# Patient Record
Sex: Female | Born: 1980 | Race: White | Hispanic: No | Marital: Married | Smoking: Never smoker
Health system: Southern US, Community
[De-identification: ages and names within clinical notes are randomized; demographics above are authoritative.]

## PROBLEM LIST (undated history)

## (undated) DIAGNOSIS — I8390 Asymptomatic varicose veins of unspecified lower extremity: Secondary | ICD-10-CM

## (undated) DIAGNOSIS — M79606 Pain in leg, unspecified: Secondary | ICD-10-CM

## (undated) DIAGNOSIS — R0989 Other specified symptoms and signs involving the circulatory and respiratory systems: Secondary | ICD-10-CM

## (undated) DIAGNOSIS — E162 Hypoglycemia, unspecified: Secondary | ICD-10-CM

## (undated) DIAGNOSIS — F329 Major depressive disorder, single episode, unspecified: Secondary | ICD-10-CM

## (undated) DIAGNOSIS — R519 Headache, unspecified: Secondary | ICD-10-CM

## (undated) DIAGNOSIS — Z8619 Personal history of other infectious and parasitic diseases: Secondary | ICD-10-CM

## (undated) DIAGNOSIS — R51 Headache: Secondary | ICD-10-CM

## (undated) HISTORY — DX: Pain in leg, unspecified: M79.606

## (undated) HISTORY — DX: Other specified symptoms and signs involving the circulatory and respiratory systems: R09.89

## (undated) HISTORY — PX: TONSILLECTOMY: SUR1361

## (undated) HISTORY — DX: Personal history of other infectious and parasitic diseases: Z86.19

## (undated) HISTORY — DX: Asymptomatic varicose veins of unspecified lower extremity: I83.90

---

## 2005-06-13 ENCOUNTER — Encounter: Admission: RE | Admit: 2005-06-13 | Discharge: 2005-06-13 | Payer: Self-pay | Admitting: Family Medicine

## 2006-12-24 ENCOUNTER — Inpatient Hospital Stay (HOSPITAL_COMMUNITY): Admission: AD | Admit: 2006-12-24 | Discharge: 2006-12-24 | Payer: Self-pay | Admitting: Obstetrics and Gynecology

## 2006-12-30 ENCOUNTER — Inpatient Hospital Stay (HOSPITAL_COMMUNITY): Admission: AD | Admit: 2006-12-30 | Discharge: 2006-12-30 | Payer: Self-pay | Admitting: Obstetrics and Gynecology

## 2006-12-30 ENCOUNTER — Emergency Department (HOSPITAL_COMMUNITY): Admission: EM | Admit: 2006-12-30 | Discharge: 2006-12-30 | Payer: Self-pay | Admitting: Emergency Medicine

## 2007-04-28 ENCOUNTER — Inpatient Hospital Stay (HOSPITAL_COMMUNITY): Admission: AD | Admit: 2007-04-28 | Discharge: 2007-05-03 | Payer: Self-pay | Admitting: Obstetrics and Gynecology

## 2007-08-03 ENCOUNTER — Encounter: Admission: RE | Admit: 2007-08-03 | Discharge: 2007-08-03 | Payer: Self-pay | Admitting: Family Medicine

## 2008-11-23 IMAGING — US US ABDOMEN COMPLETE
1 series · 14 of 25 positions shown · non-contrast
Comparison: none

CLINICAL DATA: Elevated liver function tests.
 ABDOMEN ULTRASOUND:
TECHNIQUE: Complete abdominal ultrasound examination was performed including evaluation of the liver, gallbladder, bile ducts, pancreas, kidneys, spleen, IVC, and abdominal aorta.

[Series 1: us abdomen complete · 0.29mm/px · 14 of 70 slices shown]
[im 1/70]
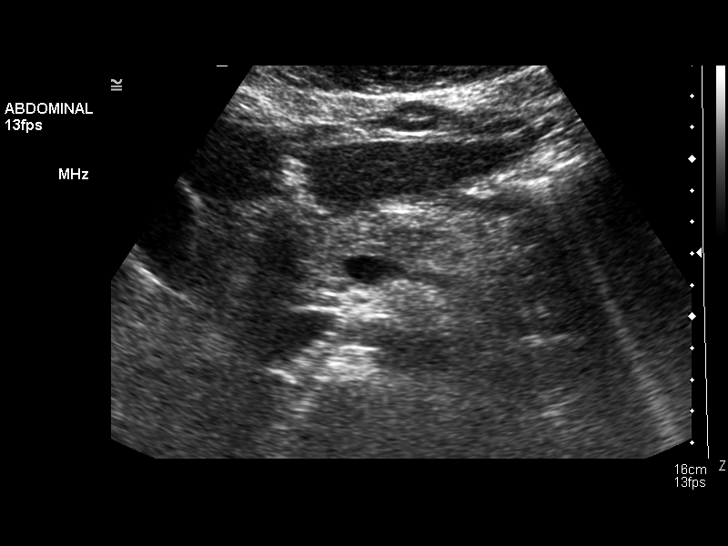
[im 6/70]
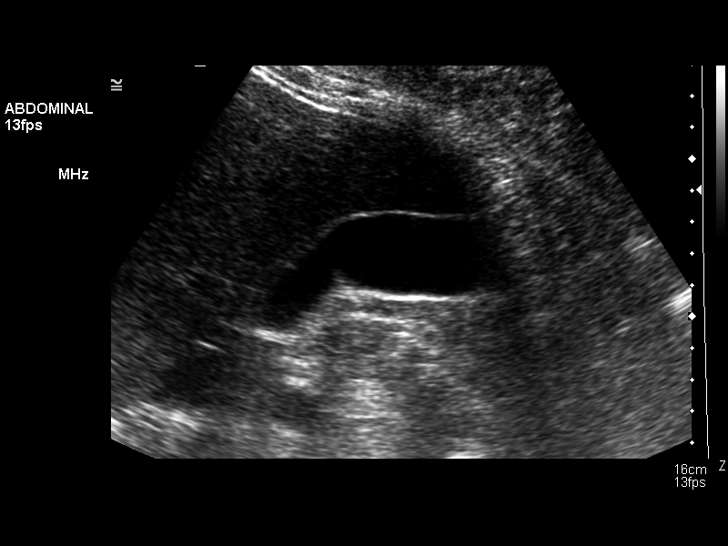
[im 12/70]
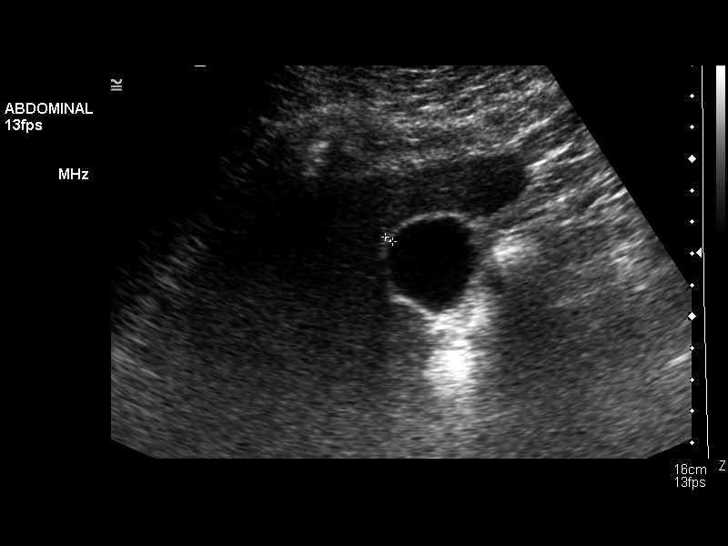
[im 18/70]
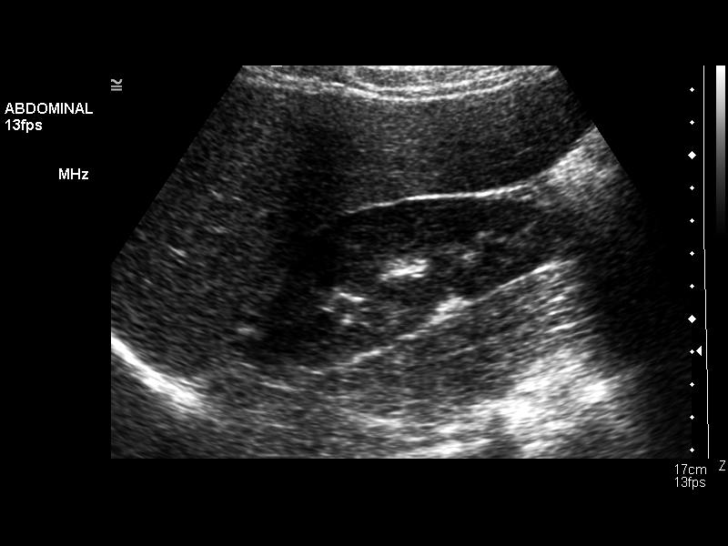
[im 24/70]
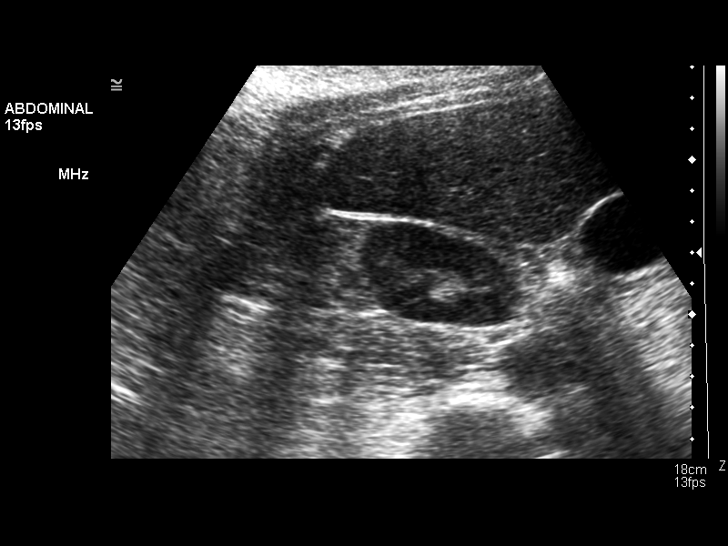
[im 26/70]
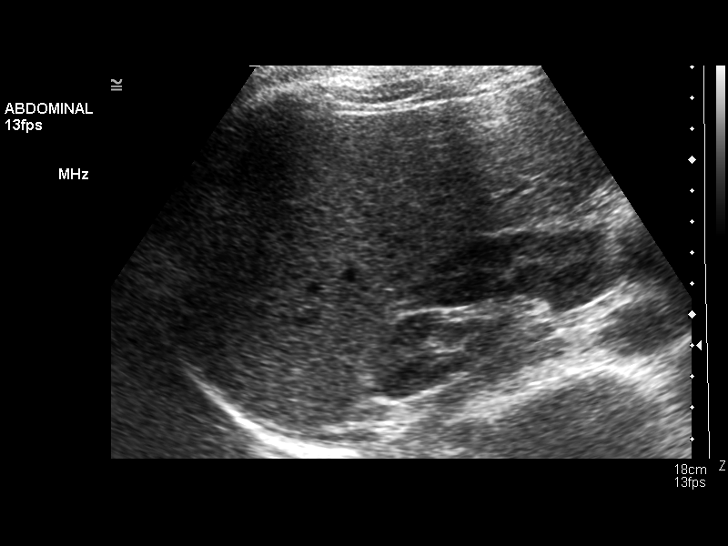
[im 32/70]
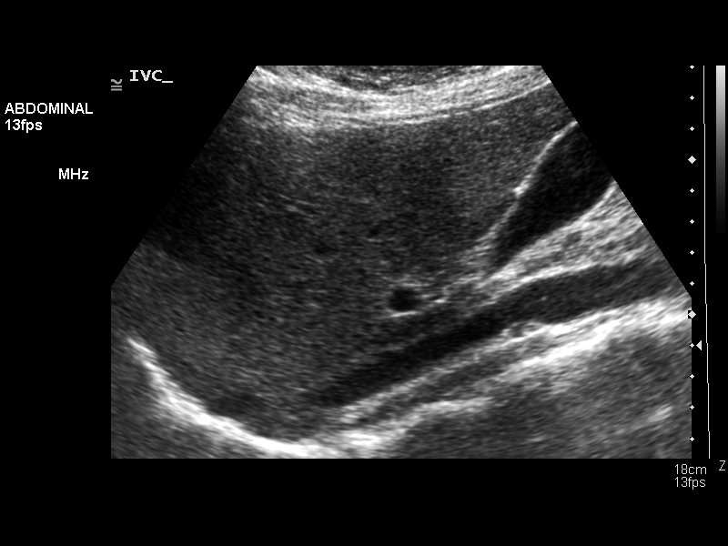
[im 38/70]
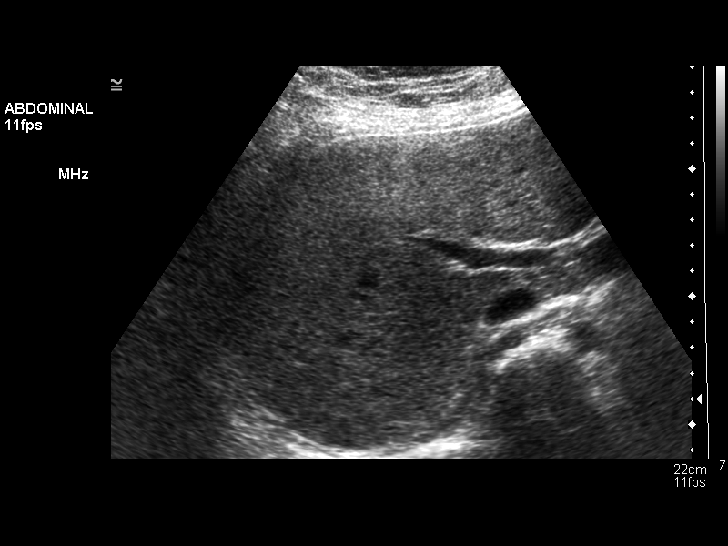
[im 44/70]
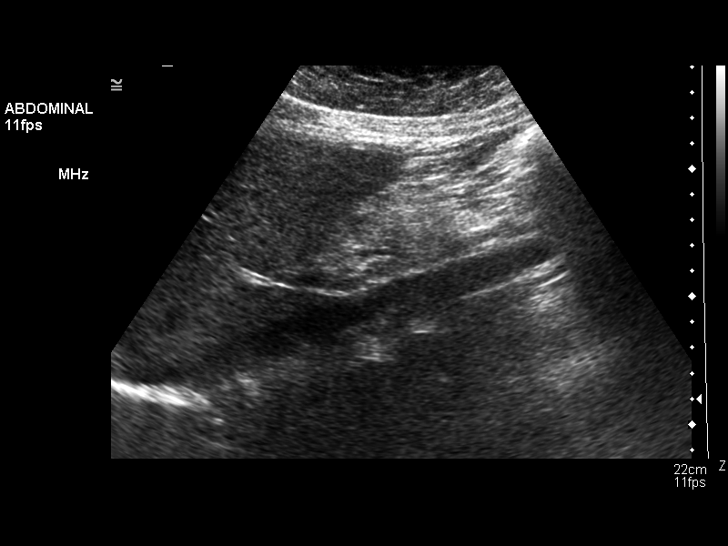
[im 47/70]
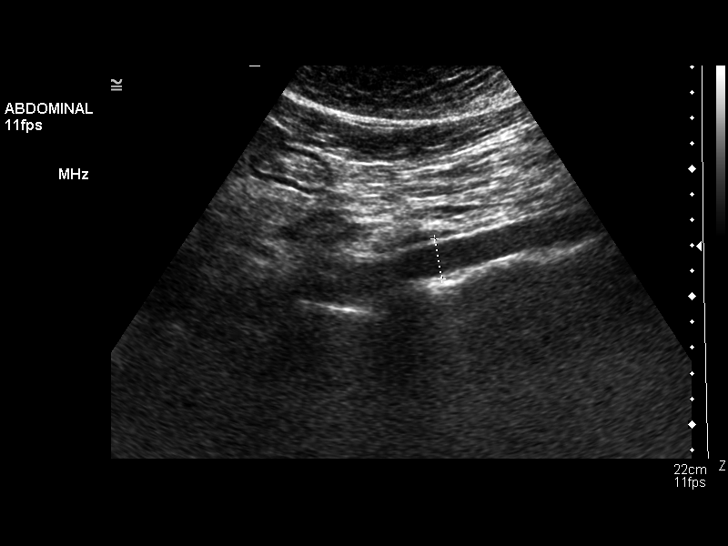
[im 52/70]
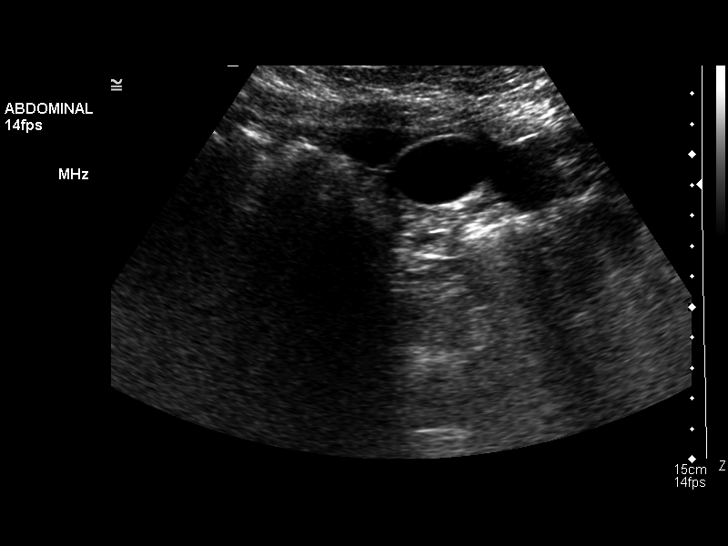
[im 58/70]
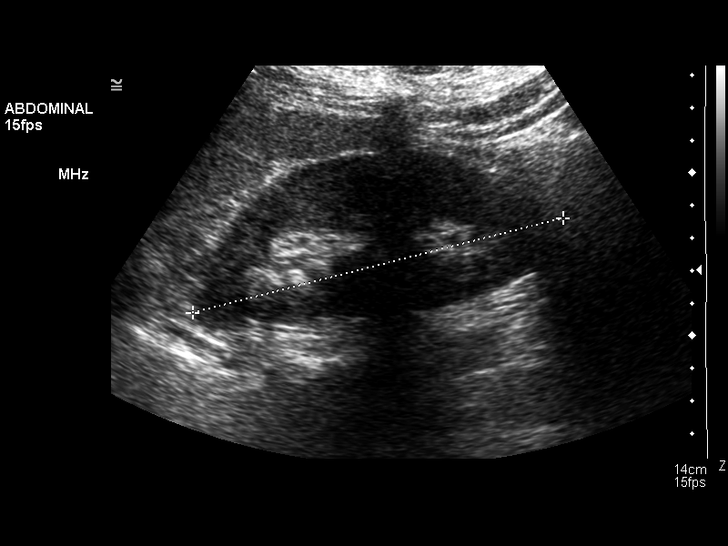
[im 64/70]
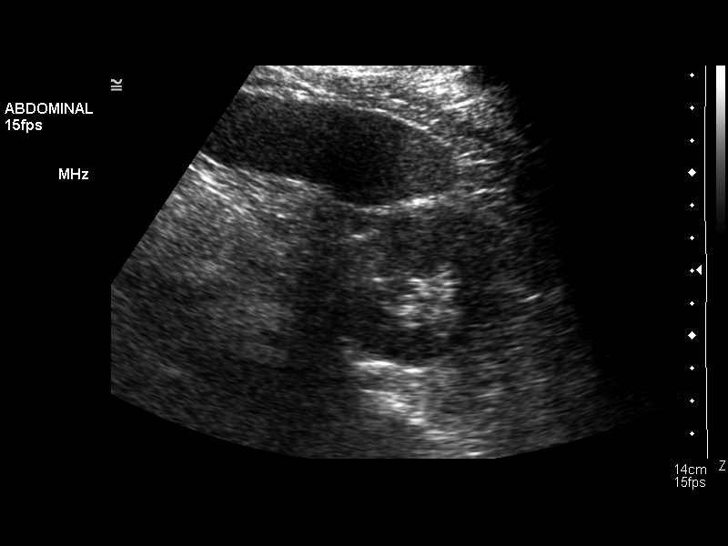
[im 70/70]
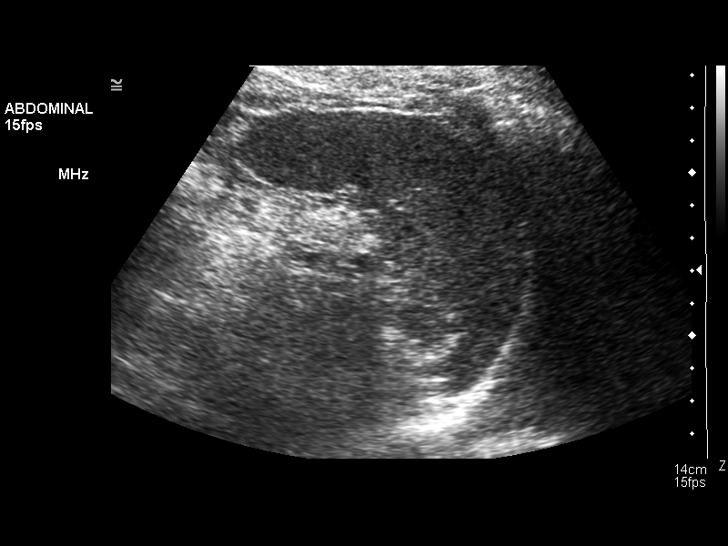

[14 of 25 positions shown; findings below may reference images not displayed]

FINDINGS: Currently, liver size is normal with no interval focal lesions.  Gallbladder is sonographically normal without sludge nor gallstones with normal wall thickness of 3 mm.  No dilated intrahepatic nor extrahepatic bile ducts are seen with the common bile duct measuring normally at 3 mm.  Pancreatic tail is obscured with remaining pancreas appearing normal.  Inferior vena cava, spleen (10.7 cm long), bilateral kidneys (11.2 cm long), abdominal aorta (maximum diameter 1.9 cm) appear sonographically normal.
IMPRESSION: 1.  Tail of pancreas obscured.
 2.  Otherwise normal ? no persistent slight hepatomegaly seen.

## 2008-11-23 IMAGING — US US TRANSVAGINAL NON-OB
1 series · 14 of 25 positions shown · non-contrast
Comparison: [HOSPITAL] obstetrical ultrasound 12/30/06.

CLINICAL DATA: Question enlarged uterus compressing vessels with lower extremity edema.  
 TRANSABDOMINAL/TRANSVAGINAL PELVIC ULTRASOUND:
TECHNIQUE: Both transabdominal and transvaginal ultrasound examinations of the pelvis were performed including evaluation of the uterus, ovaries, adnexal regions, and pelvic cul-de-sac.

[Series 1: us transvaginal non-ob · 0.37mm/px · 14 of 47 slices shown]
[im 1/47]
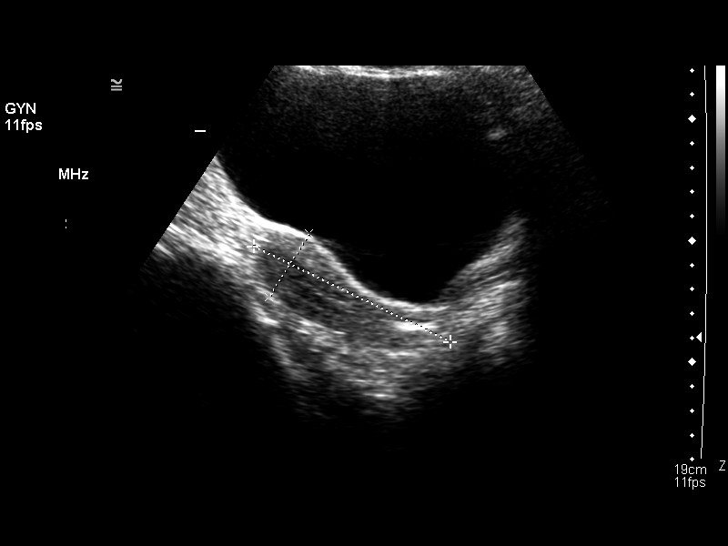
[im 4/47]
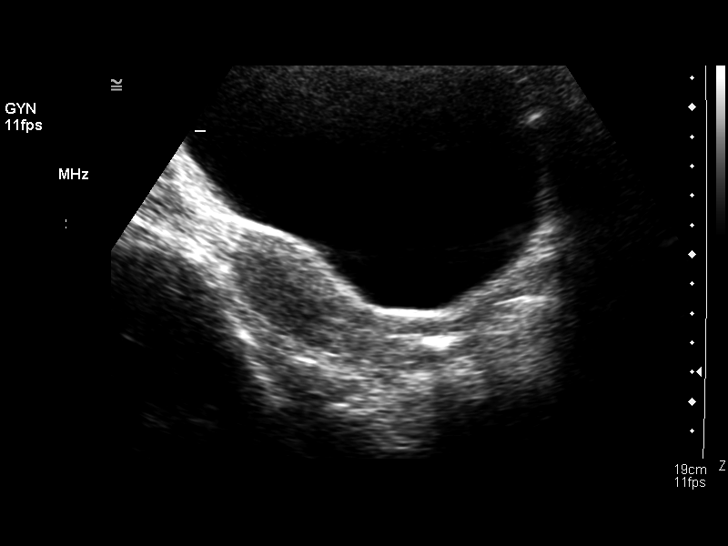
[im 8/47]
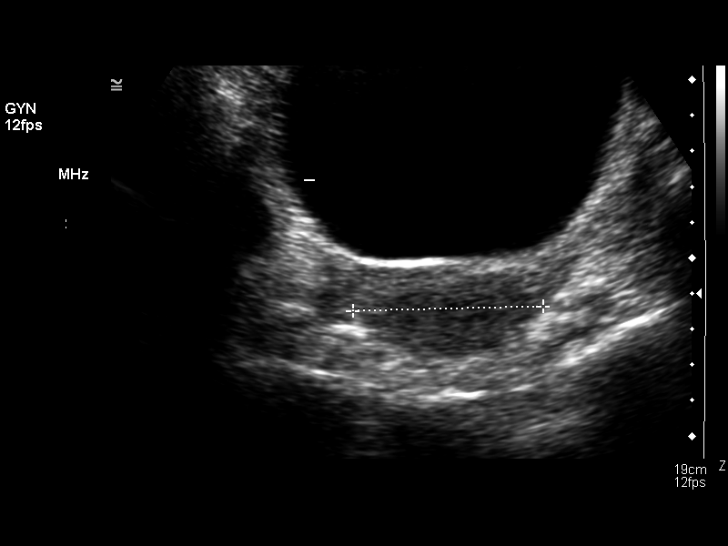
[im 12/47]
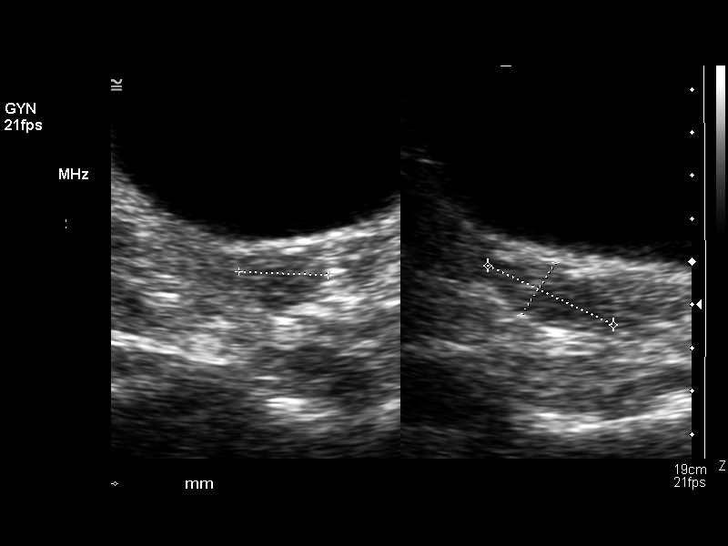
[im 16/47]
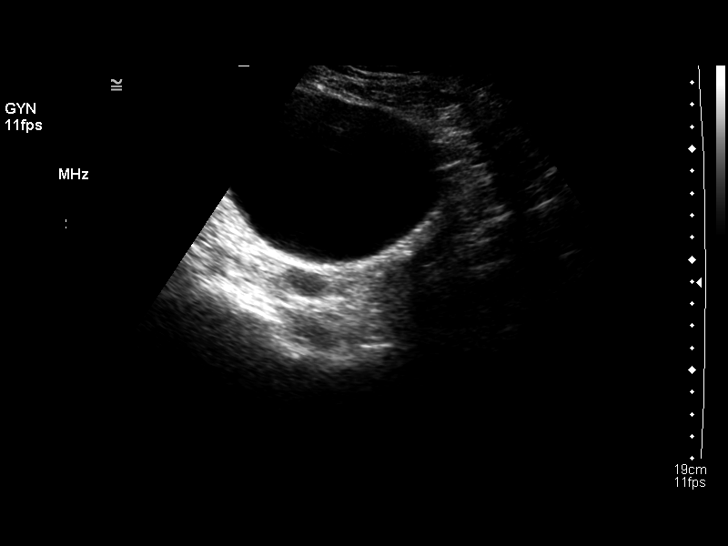
[im 18/47]
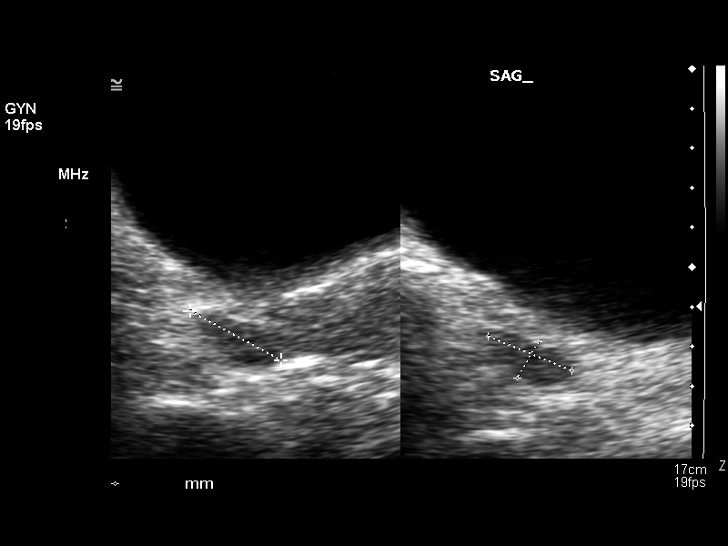
[im 22/47]
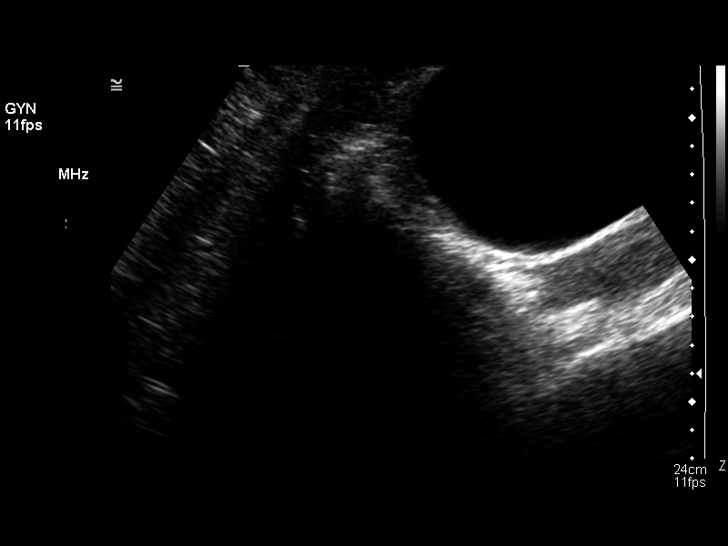
[im 25/47]
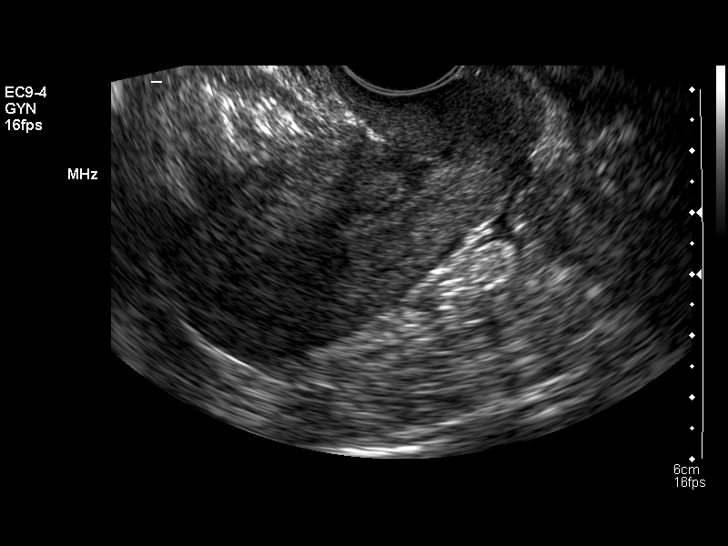
[im 29/47]
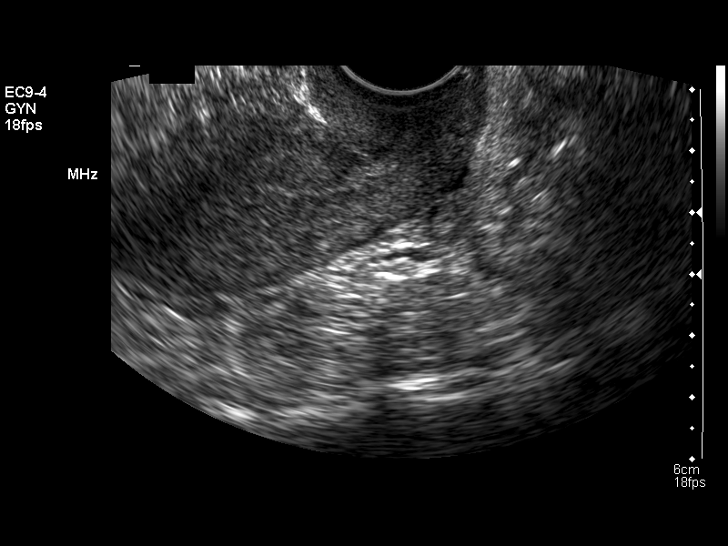
[im 31/47]
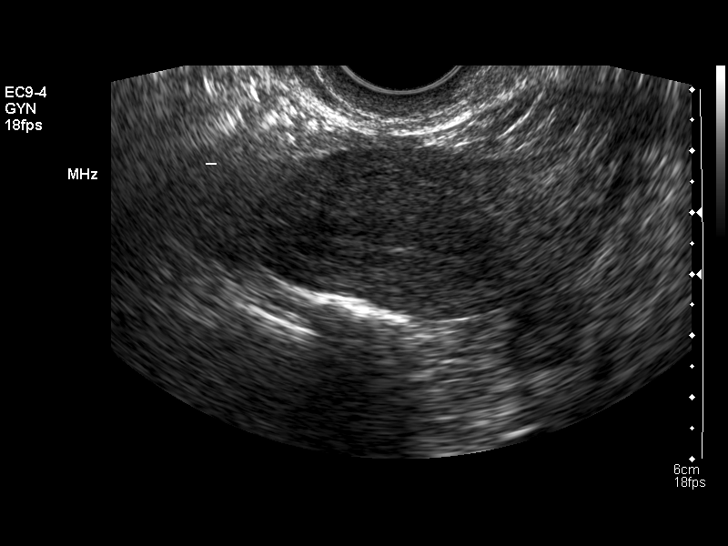
[im 35/47]
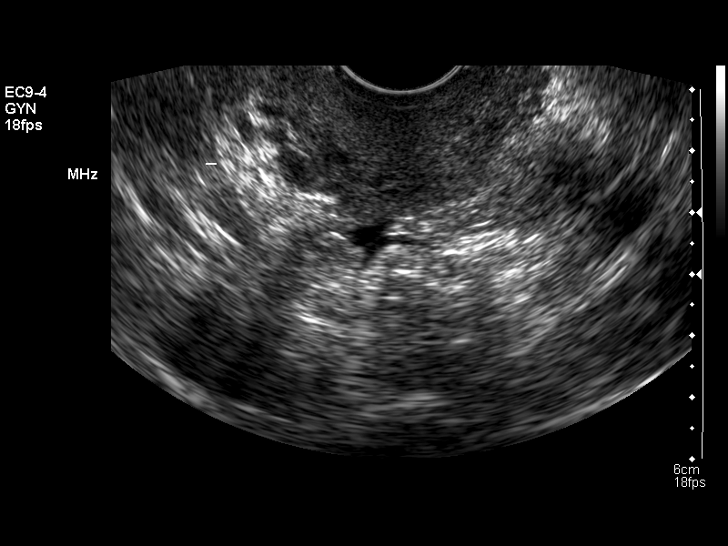
[im 39/47]
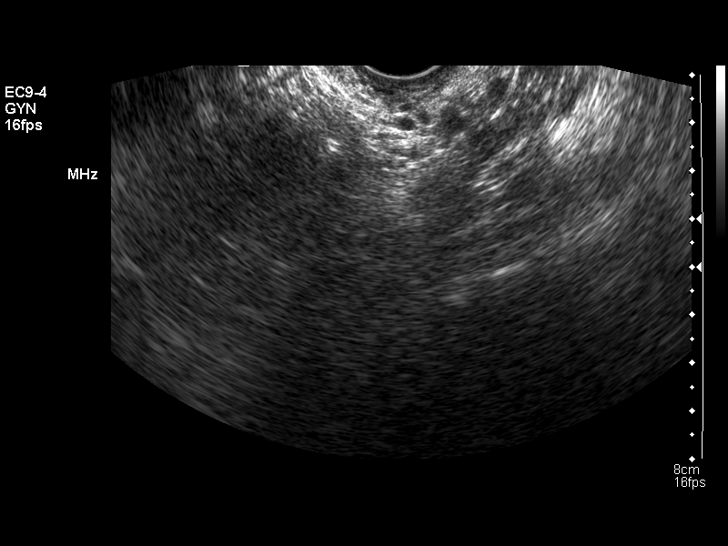
[im 43/47]
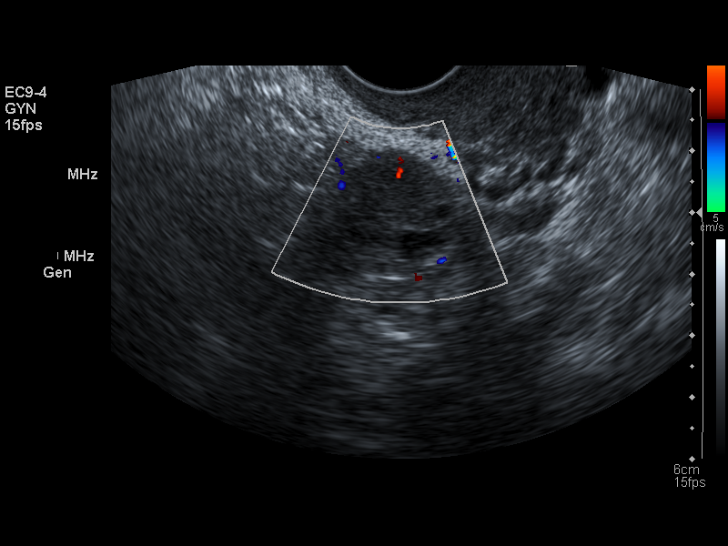
[im 47/47]
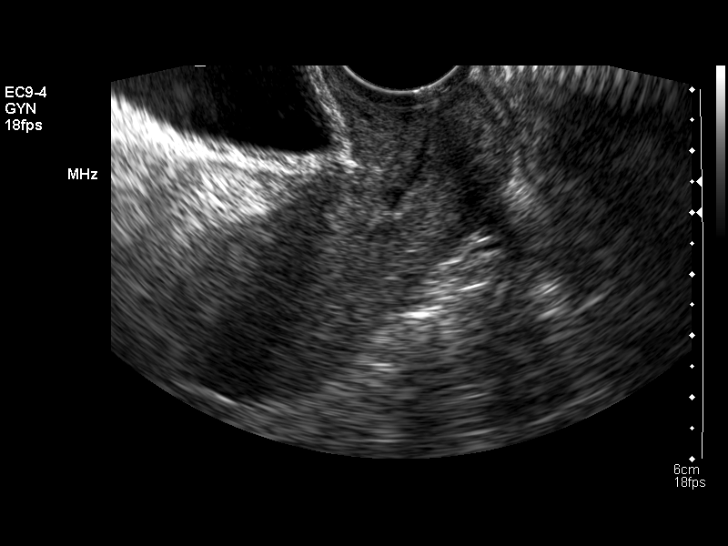

[14 of 25 positions shown; findings below may reference images not displayed]

FINDINGS: The uterus is sonographically normal in size measuring 7cm long x 3.2cm AP x 4.6cm wide with normal 3mm fundal endometrial stripe thickness.  No pelvic free fluid is seen.  Bilateral ovaries are sonographically normal with the right measuring 1.9cm long x 2cm AP x 1.5cm wide and the left 2.5cm long x 1.7cm AP x 1.3cm wide.
IMPRESSION: Normal.

## 2010-09-26 DIAGNOSIS — F32A Depression, unspecified: Secondary | ICD-10-CM

## 2010-09-26 HISTORY — DX: Depression, unspecified: F32.A

## 2010-11-05 DIAGNOSIS — Z3480 Encounter for supervision of other normal pregnancy, unspecified trimester: Secondary | ICD-10-CM

## 2010-11-11 ENCOUNTER — Other Ambulatory Visit: Payer: Self-pay | Admitting: Obstetrics & Gynecology

## 2010-11-11 DIAGNOSIS — Z3689 Encounter for other specified antenatal screening: Secondary | ICD-10-CM

## 2010-11-15 ENCOUNTER — Other Ambulatory Visit: Payer: Self-pay | Admitting: Obstetrics & Gynecology

## 2010-11-15 ENCOUNTER — Ambulatory Visit (HOSPITAL_COMMUNITY)
Admission: RE | Admit: 2010-11-15 | Discharge: 2010-11-15 | Disposition: A | Discharge status: Home / Self Care | Payer: Medicaid Other | Source: Ambulatory Visit | Attending: Obstetrics & Gynecology | Admitting: Obstetrics & Gynecology

## 2010-11-15 ENCOUNTER — Encounter (HOSPITAL_COMMUNITY): Payer: Self-pay

## 2010-11-15 DIAGNOSIS — Z363 Encounter for antenatal screening for malformations: Secondary | ICD-10-CM | POA: Insufficient documentation

## 2010-11-15 DIAGNOSIS — Z1389 Encounter for screening for other disorder: Secondary | ICD-10-CM | POA: Insufficient documentation

## 2010-11-15 DIAGNOSIS — Z3689 Encounter for other specified antenatal screening: Secondary | ICD-10-CM

## 2010-11-15 DIAGNOSIS — O358XX0 Maternal care for other (suspected) fetal abnormality and damage, not applicable or unspecified: Secondary | ICD-10-CM | POA: Insufficient documentation

## 2010-11-19 ENCOUNTER — Encounter: Payer: Medicaid Other | Attending: Obstetrics & Gynecology | Admitting: Dietician

## 2010-11-19 DIAGNOSIS — O99810 Abnormal glucose complicating pregnancy: Secondary | ICD-10-CM | POA: Insufficient documentation

## 2010-11-19 DIAGNOSIS — Z713 Dietary counseling and surveillance: Secondary | ICD-10-CM | POA: Insufficient documentation

## 2010-12-10 ENCOUNTER — Other Ambulatory Visit: Payer: Self-pay | Admitting: Family Medicine

## 2010-12-10 DIAGNOSIS — O34219 Maternal care for unspecified type scar from previous cesarean delivery: Secondary | ICD-10-CM

## 2010-12-10 DIAGNOSIS — Z3480 Encounter for supervision of other normal pregnancy, unspecified trimester: Secondary | ICD-10-CM

## 2010-12-10 DIAGNOSIS — Z3689 Encounter for other specified antenatal screening: Secondary | ICD-10-CM

## 2010-12-31 ENCOUNTER — Ambulatory Visit (HOSPITAL_COMMUNITY)
Admission: RE | Admit: 2010-12-31 | Discharge: 2010-12-31 | Disposition: A | Discharge status: Home / Self Care | Payer: Medicaid Other | Source: Ambulatory Visit | Attending: Family Medicine | Admitting: Family Medicine

## 2010-12-31 DIAGNOSIS — Z3689 Encounter for other specified antenatal screening: Secondary | ICD-10-CM | POA: Insufficient documentation

## 2011-01-07 DIAGNOSIS — Z3480 Encounter for supervision of other normal pregnancy, unspecified trimester: Secondary | ICD-10-CM

## 2011-01-21 ENCOUNTER — Encounter (INDEPENDENT_AMBULATORY_CARE_PROVIDER_SITE_OTHER): Payer: Medicaid Other

## 2011-01-21 DIAGNOSIS — Z3480 Encounter for supervision of other normal pregnancy, unspecified trimester: Secondary | ICD-10-CM

## 2011-01-21 DIAGNOSIS — O34219 Maternal care for unspecified type scar from previous cesarean delivery: Secondary | ICD-10-CM

## 2011-02-07 ENCOUNTER — Encounter (INDEPENDENT_AMBULATORY_CARE_PROVIDER_SITE_OTHER): Payer: Medicaid Other

## 2011-02-07 DIAGNOSIS — O34219 Maternal care for unspecified type scar from previous cesarean delivery: Secondary | ICD-10-CM

## 2011-02-07 DIAGNOSIS — Z3480 Encounter for supervision of other normal pregnancy, unspecified trimester: Secondary | ICD-10-CM

## 2011-02-08 NOTE — H&P (Signed)
NAMEAUDREANA, Tara Rocha                ACCOUNT NO.:  [id number]   MEDICAL RECORD NO.:  [id number]          PATIENT TYPE:  INP   LOCATION:                                [facility]   PHYSICIAN:  Janine Limbo, M.D.DATE OF BIRTH:  May 01, 1981   DATE OF ADMISSION:  04/28/2007  DATE OF DISCHARGE:                              HISTORY & PHYSICAL   HISTORY OF PRESENT ILLNESS:  Ms. Standifer is a 30 year old female, gravida  1 para 0, who presents at 76 and 1/[redacted] weeks gestation ([facility] 04/25/2007).  The patient has been followed at the [facility] and  Gynecology divorced of [facility].  She has been  followed by the nurse midwife surface.  The pregnancy has been  complicated by migraine headaches and by polyhydramnios.  The patient  has a large for gestational age infant.  The patient had an ultrasound  performed on 04/17/2007 which showed an estimated fetal weight of 8  pounds and 9 ounces.  This was approximately the 89th percentile.  Polyhydramnios was documented.  The patient's Glucola screen was 102.   ALLERGIES:  No known drug allergies except that aspirin does cause  bruising.  She can take ibuprofen without difficulty.   PAST MEDICAL HISTORY:  The patient has a history of migraine headaches  as mentioned above.  She had her wisdom teeth removed at age 6.  She  had her tonsils removed at age 26.   SOCIAL HISTORY:  The patient denies cigarette use, alcohol use, and  recreational drug use.   REVIEW OF SYSTEMS:  Normal pregnancy complaints.   FAMILY HISTORY:  Noncontributory.   PHYSICAL EXAMINATION:  Weight is 285 pounds, height is 5 feet 5 inches.  HEENT:  Within normal limits.  CHEST:  Clear.  HEART:  Regular rate and rhythm.  BREASTS:  Without masses.  ABDOMEN:  Gravid with a fundal height of 41 cm.  EXTREMITIES:  Grossly normal, except for 1-2+ edema.  NEUROLOGIC:  Grossly normal.  PELVIC EXAM:  Cervix is 3 cm dilated, 75% of base, and -1 to  -2 in  station.   LABORATORY DATA:  Blood type is O positive, antibody screen negative,  VDRL nonreactive, Rubella immune, HBSAG is negative, 3rd trimester beta  strep is negative.   ASSESSMENT:  1. Forty and 1/[redacted] week gestation.  2. Polyhydramnios.  3. Obesity.   PLAN:  The patient will be admitted for induction of labor.  We  discussed the fact that C. Section is a possibility.  The patient does  have a narrow pelvic outlet but she wishes to try to have a vaginal  delivery.      Janine Limbo, M.D.  Electronically Signed     AVS/MEDQ  D:  04/26/2007  T:  04/26/2007  Job:  [id number]

## 2011-02-08 NOTE — H&P (Signed)
Tara Rocha, Tara Rocha                ACCOUNT NO.:  [id number]   MEDICAL RECORD NO.:  [id number]          PATIENT TYPE:  INP   LOCATION:  9162                          [facility]ILITY:  [facility]   PHYSICIAN:  Janine Limbo, M.D.DATE OF BIRTH:  1981/01/05   DATE OF ADMISSION:  04/28/2007  DATE OF DISCHARGE:                              HISTORY & PHYSICAL   HISTORY OF THE PRESENT ILLNESS:  This patient is a 30 year old gravida  1, para 0 at 40-3/7ths weeks who presents tonight for induction  secondary to polyhydramnios and large for gestational age noted on  ultrasound approximately two weeks ago.  The patient's pregnancy has  been remarkable for:  1. ASPIRIN ALLERGY, although this sensitivity is that it causes her to      have more bruising.  There has been anaphylaxis noted.  The patient      can take ibuprofen.  2. Migraine headaches.  3. Beta Strep negative.  4. Increased BMI.   PRENATAL LABORATORY DATA:  Blood type is O positive, Rh antibody  negative.  VDRL nonreactive.  Rubella titer positive.  Hepatitis B  surface antigen negative.  HIV was declined, but was negative at 28  weeks.  Cystic fibrosis testing was negative.  GC and Chlamydia cultures  were negative in January 2008.  Pap was normal in September 2007.  Hemoglobin upon entering the practice was 13.3; it was 13.2 at 28 weeks.  One-hour Glucola was normal at 102.  Group B Strep culture was negative  at 36 weeks.   HISTORY OF PRESENT PREGNANCY:  The patient entered care at approximately  13 weeks.  She had had a Pap that was within normal limits, except for  inflammation in January and September 2007.  She had some nausea in  early pregnancy, but was much improved with Zofran.  She had an  ultrasound at her first visit at 13 weeks, which showed normal growth  and development, and posterior placenta was noted.  She declined  quadruple screen.  She was using ibuprofen for migraines at 18 weeks.  She had another ultrasound at  that time, which showed normal growth and  development.  Glucola was normal.   The patient was seen in maternity admissions on December 24, 2006 for  kidney type symptoms although the urine culture was negative.  She still  had some pain and pressure in her lower abdomen subsequent to that.  An  ultrasound was done that was normal.  She had hip pain at approximately  25 weeks.  She had a Glucola that was normal at 28 weeks.  She had  another ultrasound at that time showing growth at the 82nd percentile  and fluid was at the [facility].  Another ultrasound was performed  at 36 weeks for size greater than dates.  Polyhydramnios was noted with  an AFI of 27.28.23 cm at greater than the 97th percentile.  Estimated  fetal weight was 7 pounds 3 ounces a the 80.5 percentile.  Fasting blood  sugar was checked at her next visit, which was normal.  She had another  ultrasound  at 38 weeks showing growth at the 88th percentile with an  estimated fetal weight of 8 pounds 9 ounces.  AFI was 26.42; again  greater than the 96th percentile.   The patient was seen over the next week and consult was held with Dr.  Stefano Gaul who recommended induction.  This is thus scheduled for this  evening at the first available time.   PAST OBSTETRICAL HISTORY:  The patient is a primigravida.   PAST MEDICAL HISTORY:  1. The patient was on oral contraceptives until those were      discontinued in September 2007.  2. The patient had a UTI at age 20.  3. The patient has had some migraines in the past and used Excedrin      Migraine for those.   PAST SURGICAL HISTORY:  The past surgical history includes wisdom teeth  removed at age 8 and tonsillectomy at age 19.   ALLERGIES:  ASPIRIN, which causes bruising.  The patient can take  ibuprofen.  She has no anaphylactic reaction to aspirin.   FAMILY HISTORY:  The patient's mother has varicosities, asthma and  hypothyroidism.  Her father had a stroke.  Her mother and  sisters have  migraines.  Maternal grandmother had gallbladder disease.   GENETIC HISTORY:  The genetic history is remarkable for father of the  baby's uncles are twins and the father of the baby's paternal  grandfather  of twins.  The patient has a family history of twins.   SOCIAL HISTORY:  The patient is married to the father of the baby.  He  is involved and supportive.  His name is Zanae Kuehnle.  The patient is  college educated.  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  Her  husband is __________  educated and he is employed as a Radiographer, therapeutic.   The patient has been followed by the Physician Services at [facility].  She denies any alcohol, drug or tobacco use during  this pregnancy.   PHYSICAL EXAMINATION:  VITAL SIGNS:  The vital signs are stable.  The  patient is afebrile.  HEENT:  The head, eyes, ears, nose and throat are within normal limits.  CHEST:  Bilateral breath sounds are clear.  HEART:  The heart has a regular rate and rhythm without murmurs.  BREASTS:  The patient's breasts are soft and nontender.  ABDOMEN:  The fundal height is approximately 44 cm.  Estimated fetal  weight is 8.5 pounds.  Uterine contractions are none.  Fetal heart rate  is reactive.  PELVIC EXAMINATION:  Cervical exam is 2 cm, 60%, vertex and a -1 to -2  station.  The cervix is fairly firm and is very posterior.  Fetal heart  rate is reactive as noted above.  There are no contractions noted.  EXTREMITIES:  Deep tendon reflexes are 2+ without clonus.  There is  trace edema noted.   IMPRESSION:  1. Intrauterine pregnancy at 40-3/7ths weeks.  2. Polyhydramnios.  3. Large for gestational age.  4. Negative group B Streptococcus.   PLAN:  1. Admit to the birthing suite; consult with Dr .Dierdre Forth as      attending physician.  2. Routine C.N.M. orders.  3. In light of the firmness of the cervix and the posteriorness of the      cervix we will use Cervidil  tonight and then we will pull it in the      morning and start pitocin unless  the cervix is highly favorable for      artificial rupture of      membranes as the primary method of induction.  4. Close observation of maternal labor progress in light of the      possibility of large for gestational age and shoulder dystocia.      Renaldo Reel Emilee Hero, C.N.M.      Janine Limbo, M.D.  Electronically Signed    VLL/MEDQ  D:  04/29/2007  T:  04/29/2007  Job:  [id number]

## 2011-02-08 NOTE — Discharge Summary (Signed)
Tara Rocha, Tara Rocha                ACCOUNT NO.:  [id number]   MEDICAL RECORD NO.:  [id number]          PATIENT TYPE:  INP   LOCATION:  9103                          [facility]ILITY:  [facility]   PHYSICIAN:  Osborn Coho, M.D.   DATE OF BIRTH:  September 26, 1981   DATE OF ADMISSION:  04/28/2007  DATE OF DISCHARGE:                               DISCHARGE SUMMARY   ADMITTING DIAGNOSES:  1. Intrauterine pregnancy at 40-1/[redacted] week gestation.  2. Polyhydramnios.  3. Obesity.  4. Possible large for gestational age.   DISCHARGE DIAGNOSES:  1. Intrauterine pregnancy at 40-1/[redacted] week gestation.  2. Polyhydramnios.  3. Failure to descend.  4. Obesity.   PROCEDURES:  1. Primary low transverse cesarean section.  2. Epidural anesthesia.   HOSPITAL COURSE:  Tara Rocha is a 30 year old gravida 1, para 0, at 94-  3/7 weeks, who presented for induction on April 28, 2007, secondary to  polyhydramnios and possible LGA on last ultrasound.  Pregnancy had been  remarkable for (1) Polyhydramnios with an AFI of 26.42 which was greater  than the 97th percentile.  (2) LGA with estimated fetal weight of 88th  percentile.  (3) Group B negative.  (4) ASPIRIN ALLERGY BUT CAN TAKE  IBUPROFEN.  HER REACTION TO ASPIRIN IS EASY BRUISING, NO ANAPHYLAXIS.  (5) Elevated BMI.  On admission, cervix was posterior, 2 cm, 60-70%, firm and posterior.  Cervidil was placed at night.  The patient did continue to labor off of  that.  By the morning of August 3, she was 4 cm, 80% vertex, and -2  station with an intact sac of water.  Artificial rupture of membranes  was accomplished at about noon with cervix unchanged.  There was copious  clear fluid noted.  Fetal heart rate was reactive.  Scalp lead and IAPC  were placed showing inadequate labor.  Pitocin augmentation was  recommended.  The patient had 1 deceleration at approximately 2:30.  At  that time, she was 6 cm.  MVUs were 180.  The heart rate __________ was  reactive.  The patient came  complete at approximately 10:30 with the  vertex at a -2 station.  Fetal heart rate was stable.  She began pushing  at that time.  She pushed for approximately 2 hours without any  appreciable descent.  Fetal heart rate was reactive without any  decelerations.  Uterine contractions were adequate.  Maternal exhaustion  was noted.  Dr. Stefano Gaul was consulted, and the patient indicated she  would prefer to proceed with a cesarean section rather than continue  pushing.  Dr. Stefano Gaul performed a primary low transverse cesarean  section on the patient with delivery of a viable female by the name of  Tara Rocha, weight 8 pounds 8 ounces.  Apgars were 9 and 10.  There was a J-P  drain placed in the left lower quadrant.  The patient had epidural  anesthesia for the surgery.  Infant was taken to the full-term nursery.  Mother was taken to recovery in good condition.  By postop day 1, the  patient was doing well.  She was up  ad lib.  Her hemoglobin was 10.6,  white blood cell count was 14, and platelet count was 191.  J-P drain  had drained approximately 30 mL overnight but then had a small amount of  drainage during the day.  There was some thought that she might want to  go home on day 2; however, on the morning of day 2, she was being noted  to have some tearfulness.  She also was more sore than the day before.  She was having to use the S&S system for feeding and had received  minimal sleep.  Her vital signs were stable.  Her physical exam was  within normal limits.  Her J-P drain had 10 mL in it overnight.  The  decision was made to encourage the patient to stay another day.  Motrin  was begun around the clock, and the patient was encouraged to get some  rest, and encouragement was given regarding her status.  By the morning  of May 03, 2007, the patient was doing much better.  She had had bowel  movement after using a Dulcolax suppository.  Her pain control was under  a good regimen of Motrin and  Percocet, and the J-P drain was removed  without difficulty.  She was deemed to have received full benefit of her  hospital stay and was discharged home.  Breast feeding was going much  better.   DISCHARGE CONDITION:  Stable.   DISCHARGE MEDICATIONS:  1. Motrin 600 mg p.o. q.6h. p.r.n. pain.  2. Percocet 5/325, 1-2 p.o. q.3-4h. p.r.n. pain.  3. Micronor 1 p.o. daily for contraception.   Discharge follow up will occur in 6 weeks at [facility].      Tara Rocha, C.N.M.      Osborn Coho, M.D.  Electronically Signed    VLL/MEDQ  D:  05/03/2007  T:  05/03/2007  Job:  [id number]

## 2011-02-08 NOTE — Op Note (Signed)
NAMEJAKHIA, BUXTON                ACCOUNT NO.:  [id number]   MEDICAL RECORD NO.:  [id number]          PATIENT TYPE:  INP   LOCATION:  9103                          [facility]   PHYSICIAN:  Janine Limbo, M.D.DATE OF BIRTH:  05-Nov-1980   DATE OF PROCEDURE:  04/30/2007  DATE OF DISCHARGE:                               OPERATIVE REPORT   PREOPERATIVE DIAGNOSES:  1. Term intrauterine gestation.  2. Polyhydramnios.  3. Failure to descend.  4. Obesity (weight 290 pounds).   POSTOPERATIVE DIAGNOSES:  1. Term intrauterine gestation.  2. Polyhydramnios.  3. Failure to descend.  4. Obesity (weight 290 pounds).   PROCEDURE:  Primary low transverse cesarean section.   SURGEON:  Dr. Leonard Schwartz.   FIRST ASSISTANT:  Wynelle Bourgeois, certified nurse midwife.   ANESTHETIC:  Epidural.   DISPOSITION:  Ms. Reliford is a 30 year old female, gravida 1, para 0, who  presents at [redacted] weeks gestation.  She was admitted for induction on  04/28/2007.  She was given Cervidil to ripen her cervix and Pitocin was  started on 04/29/2007.  The patient progressed through her labor very  slowly.  She did become completely dilated and she pushed for 2 hours.  The fetal head would not descend below a -2 station.  The patient  understood the indications for her surgical procedure and she accepted  the risks of, but not limited to, anesthetic complications, bleeding,  infection, and possible damage to surrounding organs.   FINDINGS:  An 8 pounds 8 ounces female infant (Clara) was delivered from  a cephalic presentation.  The apparatus were 9 at one minute and 10 at  five minutes.  The fallopian tubes, the ovaries, and the uterus were  normal for the gravid state.   PROCEDURE:  The patient was taken to the operating room where it was  determined that the epidural she had received for labor would be  adequate for cesarean section.  The patient's abdomen was prepped with  multiple layers of  Betadine and then sterilely draped.  A Foley catheter  had previously been placed.  The lower abdomen was injected with 10 mL  of 0.5% Marcaine with epinephrine.  A low transverse incision was made  in the abdomen and carried sharply through the subcutaneous tissue,  fascia, and the anterior peritoneum.  The bladder flap was developed.  An incision was made in the lower uterine segment and extended in a low  transverse fashion.  The fetal head was delivered without difficulty.  The mouth and nose were suctioned.  The remainder of the infant was then  delivered.  The cord was clamped and cut and infant was handed to the  waiting pediatric team.  Routine cord blood studies were obtained.  The  placenta was removed.  The uterine cavity was cleaned of amniotic fluid,  clotted blood, and membranes.  The uterine incision was closed using a  running locking suture of 2-0 Vicryl followed by an imbricating suture  of 2-0 Vicryl.  Hemostasis was adequate.  The pelvis was vigorously  irrigated.  The anterior peritoneum and  abdominal musculature were  reapproximated in the midline using 2-0 Vicryl.  The fascia and the  subcutaneous layer were irrigated.  The fascia was closed using a  running suture of 0-0 Vicryl followed by three interrupted sutures of 0-  0 Vicryl.  A size 7 Jackson-Pratt drain was placed in the subcutaneous  layer and brought out through the left lower quadrant.  It was sutured  into place using 3-0 silk.  The subcutaneous layer was closed using a  running suture of 3-0 plain catgut.  The skin was reapproximated using a  subcuticular suture of 3-0 Monocryl.  Sponge, needle, and instrument  counts were correct on two occasions.  Estimated blood loss for the  procedure was 700 mL.  The patient tolerated her procedure well.  She  was taken to the recovery room in stable condition.  The infant was  taken to the full-term nursery in stable condition.  The patient was  noted to drain  clear yellow urine at end of her procedure.      Janine Limbo, M.D.  Electronically Signed     AVS/MEDQ  D:  04/30/2007  T:  04/30/2007  Job:  [id number]

## 2011-02-22 ENCOUNTER — Encounter: Payer: Medicaid Other | Admitting: Obstetrics & Gynecology

## 2011-03-01 ENCOUNTER — Encounter (INDEPENDENT_AMBULATORY_CARE_PROVIDER_SITE_OTHER): Payer: Medicaid Other | Admitting: Obstetrics & Gynecology

## 2011-03-01 DIAGNOSIS — Z3480 Encounter for supervision of other normal pregnancy, unspecified trimester: Secondary | ICD-10-CM

## 2011-03-08 ENCOUNTER — Encounter (INDEPENDENT_AMBULATORY_CARE_PROVIDER_SITE_OTHER): Payer: Medicaid Other | Admitting: Obstetrics & Gynecology

## 2011-03-08 DIAGNOSIS — O4590 Premature separation of placenta, unspecified, unspecified trimester: Secondary | ICD-10-CM

## 2011-03-08 DIAGNOSIS — E669 Obesity, unspecified: Secondary | ICD-10-CM

## 2011-03-08 DIAGNOSIS — Z3480 Encounter for supervision of other normal pregnancy, unspecified trimester: Secondary | ICD-10-CM

## 2011-03-11 ENCOUNTER — Other Ambulatory Visit (HOSPITAL_COMMUNITY): Payer: Medicaid Other

## 2011-03-14 ENCOUNTER — Encounter (HOSPITAL_COMMUNITY)
Admission: RE | Admit: 2011-03-14 | Discharge: 2011-03-14 | Disposition: A | Discharge status: Home / Self Care | Payer: BC Managed Care – PPO | Source: Ambulatory Visit | Attending: Obstetrics & Gynecology | Admitting: Obstetrics & Gynecology

## 2011-03-14 LAB — SURGICAL PCR SCREEN
MRSA, PCR: NEGATIVE
Staphylococcus aureus: NEGATIVE

## 2011-03-14 LAB — CBC
HCT: 36.5 % (ref 36.0–46.0)
MCH: 31.2 pg (ref 26.0–34.0)
Platelets: 204 10*3/uL (ref 150–400)
RBC: 4.01 MIL/uL (ref 3.87–5.11)
RDW: 12.5 % (ref 11.5–15.5)

## 2011-03-14 LAB — RPR: RPR Ser Ql: NONREACTIVE

## 2011-03-15 ENCOUNTER — Encounter (INDEPENDENT_AMBULATORY_CARE_PROVIDER_SITE_OTHER): Payer: Medicaid Other | Admitting: Obstetrics & Gynecology

## 2011-03-15 DIAGNOSIS — Z3480 Encounter for supervision of other normal pregnancy, unspecified trimester: Secondary | ICD-10-CM

## 2011-03-18 ENCOUNTER — Inpatient Hospital Stay (HOSPITAL_COMMUNITY)
Admission: RE | Admit: 2011-03-18 | Discharge: 2011-03-21 | DRG: 371 | Disposition: A | Discharge status: Home / Self Care | Payer: BC Managed Care – PPO | Source: Ambulatory Visit | Attending: Obstetrics & Gynecology | Admitting: Obstetrics & Gynecology

## 2011-03-18 DIAGNOSIS — E669 Obesity, unspecified: Secondary | ICD-10-CM | POA: Diagnosis present

## 2011-03-18 DIAGNOSIS — O34219 Maternal care for unspecified type scar from previous cesarean delivery: Principal | ICD-10-CM | POA: Diagnosis present

## 2011-03-18 DIAGNOSIS — O99214 Obesity complicating childbirth: Secondary | ICD-10-CM

## 2011-03-18 DIAGNOSIS — Z01818 Encounter for other preprocedural examination: Secondary | ICD-10-CM

## 2011-03-18 DIAGNOSIS — Z01812 Encounter for preprocedural laboratory examination: Secondary | ICD-10-CM

## 2011-03-18 LAB — ABO/RH: ABO/RH(D): O POS

## 2011-03-19 LAB — CBC
MCH: 31.3 pg (ref 26.0–34.0)
MCHC: 34.2 g/dL (ref 30.0–36.0)
MCV: 91.7 fL (ref 78.0–100.0)
Platelets: 163 10*3/uL (ref 150–400)
RBC: 3.48 MIL/uL — ABNORMAL LOW (ref 3.87–5.11)
RDW: 12.7 % (ref 11.5–15.5)
WBC: 9.5 10*3/uL (ref 4.0–10.5)

## 2011-03-19 LAB — CCBB MATERNAL DONOR DRAW

## 2011-03-22 NOTE — Op Note (Signed)
Tara Rocha, Tara Rocha                ACCOUNT NO.:  [id number]  MEDICAL RECORD NO.:  [id number]  LOCATION:  9122                          FACILITY:  [facility]  PHYSICIAN:  Allie Bossier, MD        DATE OF BIRTH:  July 20, 1981  DATE OF PROCEDURE:  03/18/2011 DATE OF DISCHARGE:                              OPERATIVE REPORT   PREOPERATIVE DIAGNOSES:  A 39 weeks' estimated gestational age, previous cesarean section, and obesity.  POSTOPERATIVE DIAGNOSES:  A 39 weeks' estimated gestational age, previous cesarean section, and obesity.  PROCEDURE:  Repeat low transverse cesarean section.  SURGEON:  Allie Bossier, MD  ANESTHESIA:  Spinal, Belva Agee, MD  COMPLICATIONS:  None.  ESTIMATED BLOOD LOSS:  500 mL.  SPECIMENS:  Cord blood.  FINDINGS:  Living female infant, Apgars 9 and 10 at 1 and 5 minutes, weight 7 pounds 4 ounces.  Normal adnexa.  Normal placenta, intact.  Detailed procedure findings, risks, benefits, and alternatives of the surgery were explained and accepted.  Consents were signed.  All questions were answered.  He was taken to the operating room and spinal anesthesia was applied.  She was placed in a dorsal lithotomy position. Her abdomen and vagina were prepped and draped in usual sterile fashion. A Foley catheter was placed, and drained clear urine throughout the case.  After adequate anesthesia was assured, a transverse incision was made at the site of her previous transverse incision.  Incision was carried down through the subcutaneous tissue to the fascia.  Fascia was scored in midline.  Fascial incision was extended bilaterally and curved slightly upwards.  The pyramidalis muscles and the middle one-third of the rectus muscles were separated in transverse fashion using electrosurgical technique.  Excellent hemostasis was maintained.  The peritoneum was entered with hemostats.  Peritoneal incision was extended with the Bovie bilaterally taking care to avoid the  bladder.  A bladder blade was placed and transverse incision was made on the moderately well- developed lower uterine segment.  Amniotomy was performed with hemostats.  Copious amount of clear fluid was noted.  The uterine incision was extended with traction bilaterally.  The baby was delivered from vertex presentation.  Mouth and nostrils were suctioned prior to delivery of shoulders.  The cord was clamped and cut after the baby was delivered and was transferred to the NICU personnel for routine care. Weight was 7 pounds 4 ounces.  Apgars were 9 and 10.  The placenta was extracted with gentle traction after massaging the uterus, and it was handed off to the cord blood collection personnel.  The uterus was left in situ and it was cleaned.  The interior of the uterus was cleaned with a dry lap sponge.  A bladder blade was placed, and the uterine incision was closed with two layers of 2-0 Vicryl suture in a running locking fashion.  The second layer imbricating the first.  Excellent hemostasis was noted.  I tilted the uterus to each side.  I was able to visualize the adnexa, they were normal.  The uterine incision was again inspected and noted to be hemostatic as were the rectus muscles and the rectus fascia.  The fascia was closed with a #1 PDS loop in a running nonlocking fashion.  No defects were palpable.  The subcutaneous tissue was irrigated, cleaned, and dried.  It was then infiltrated with 30 mL of 0.5% Marcaine.  A subcuticular closure was done with 3-0 Vicryl suture.  Instrument, sponge, and needle counts were correct.  She tolerated the procedure well.     Allie Bossier, MD     MCD/MEDQ  D:  03/18/2011  T:  03/19/2011  Job:  [id number]  Electronically Signed by Nicholaus Bloom MD on 03/22/2011 09:16:23 AM

## 2011-03-25 ENCOUNTER — Ambulatory Visit (INDEPENDENT_AMBULATORY_CARE_PROVIDER_SITE_OTHER): Payer: Medicaid Other

## 2011-03-25 DIAGNOSIS — Z09 Encounter for follow-up examination after completed treatment for conditions other than malignant neoplasm: Secondary | ICD-10-CM

## 2011-03-30 NOTE — Discharge Summary (Signed)
  NAMESTEFAN, Tara Rocha                ACCOUNT NO.:  [id number]  MEDICAL RECORD NO.:  [id number]  LOCATION:  9122                          FACILITY:  [facility]  PHYSICIAN:  Zerita Boers, N.M.   DATE OF BIRTH:  [birth year]  DATE OF ADMISSION:  03/18/2011 DATE OF DISCHARGE:  03/21/2011                              DISCHARGE SUMMARY   REASON FOR ADMISSION:  Pregnancy at 39 weeks, previous cesarean section, morbid obesity for repeat low transverse cesarean section by Dr. Marice Potter.  PROCEDURE:  She had a repeat low transverse cesarean section with Dr. Marice Potter which produced a viable female infant without complications.  DISCHARGE MEDICATIONS: 1. Percocet 5/325 1 p.o. q.4 h. p.r.n. pain. 2. Motrin 600 p.o. q.6 h. p.r.n. cramping. 3. Dulcolax suppositories 1 per rectum p.r.n. constipation.  DISCHARGE DIET:  Regular as tolerated.  PHYSICAL EXAMINATION TODAY:  VITAL SIGNS:  Stable. HEART:  Regular rhythm and rate. LUNGS:  Clear to auscultation bilaterally. ABDOMEN:  Soft.  Bowel sounds present in all 4 quadrants.  Incision is intact.  There is no redness, drainage, or signs of infection.  Fundus is firm.  Lochia is moderate. EXTREMITIES:  Trace edema in lower extremities.  FOLLOWUP:  She is to follow up with [facility] office in 1 week and in 6 weeks.  ACTIVITY LEVEL:  No heavy lifting or driving for at least 2 weeks postop up until her checkup at the [place] office and she is to follow up in the interim if there are any complications.     Zerita Boers, N.M.     DL/MEDQ  D:  29/56/2130  T:  03/21/2011  Job:  [id number]  cc:   [facility]  Electronically Signed by Wyvonnia Dusky N.M. on 03/27/2011 08:08:19 PM Electronically Signed by Nicholaus Bloom MD on 03/30/2011 08:04:05 AM

## 2011-04-19 ENCOUNTER — Ambulatory Visit (HOSPITAL_COMMUNITY)
Admission: RE | Admit: 2011-04-19 | Discharge: 2011-04-19 | Disposition: A | Discharge status: Home / Self Care | Payer: BC Managed Care – PPO | Source: Ambulatory Visit | Attending: Obstetrics & Gynecology | Admitting: Obstetrics & Gynecology

## 2011-04-19 NOTE — Progress Notes (Signed)
BABY:  Tara Rocha  DOB [birth year]  BW 7-4   WEIGHT TODAY 7-12.6 MOTHER:  Tara Rocha  PATIENT HERE TODAY WITH CONCERNS ABOUT LOW MILK SUPPLY AND FEEDING DIFFICULTIES WITH BABY.  MOM BREASTFEEDING EVERY 3 HOURS FOR 15 MIN. PER SIDE, PUMPING X 15 MIN AFTER EACH FEEDING AND OBTAINING 1 OZ, AND PC'S MOST FEEDINGS WITH 1-2 OZ OF EXPRESSED MILK AND OR FORMULA BY BOTTLE.  PATIENT PUMPS ONCE DURING THE NIGHT WHEN BABY NOT WAKING AND OBTAINS 2.5 OZ.  PATIENT STATES BABY LOST 12% IN FIRST 4 DAYS AND JUST RETURNED TO BW LAST WEEK.  BABY OBSERVED AT BOTH BREASTS FOR 15-20 MIN.  LATCH GOOD BUT BABY BEGINS NON-NUTRITIVE SUCKING AFTER 4-5 MIN.  CHANGED BABY'S POSITION TO FOOTBALL HOLD AND MOTHER SHOWN GOOD BREAST MASSAGE THAT DID IMPROVE FEEDING SOME.  BABY GAINED 20CCS ON LEFT BREAST AND 30CCS ON RIGHT BREAST.  SNS SET UP AND PATIENT INSTRUCTED ON USAGE AND CLEANING.  BABY TOOK ANOTHER 15CCS FROM SNS.  PLAN IS TO CONTINUE AS ABOVE WITH USING SNS AT MOST FEEDINGS.  PATIENT WILL CALL FOR OP APPT. PRN

## 2011-04-27 ENCOUNTER — Encounter (HOSPITAL_COMMUNITY): Payer: Medicaid Other

## 2011-04-30 ENCOUNTER — Encounter: Payer: Self-pay | Admitting: *Deleted

## 2011-04-30 DIAGNOSIS — I890 Lymphedema, not elsewhere classified: Secondary | ICD-10-CM | POA: Insufficient documentation

## 2011-05-03 ENCOUNTER — Encounter: Payer: Self-pay | Admitting: Obstetrics & Gynecology

## 2011-05-03 ENCOUNTER — Ambulatory Visit (INDEPENDENT_AMBULATORY_CARE_PROVIDER_SITE_OTHER): Payer: Medicaid Other | Admitting: Obstetrics & Gynecology

## 2011-05-03 DIAGNOSIS — Z09 Encounter for follow-up examination after completed treatment for conditions other than malignant neoplasm: Secondary | ICD-10-CM

## 2011-05-03 MED ORDER — METOCLOPRAMIDE HCL 10 MG PO TABS: 10.0000 mg | ORAL_TABLET | Freq: Three times a day (TID) | ORAL | Status: DC

## 2011-05-03 NOTE — Progress Notes (Signed)
  Subjective:    Patient ID: Tara Rocha, female    DOB: Aug 24, 1981, 30 y.o.   MRN: [id number]  HPI Patient is a 30 year old, G2, P2, female, who presents for a postpartum exam. The patient has scheduled C-section 6 weeks ago.  The patient is not sleeping well because the baby is "colicky".  She denies depression, suicidal ideation or threat to others.  The patient is having problems breast-feeding. She has not lactation multiple times. We will give her Reglan to help improve her milk flow. The patient is not having any vaginal bleeding other than sleep issues, she is overall well    Review of Systems  Constitutional: Positive for fatigue.  HENT: Negative.   Respiratory: Negative.   Cardiovascular: Negative.   Gastrointestinal: Negative.   Genitourinary: Negative.   Musculoskeletal: Negative.   Psychiatric/Behavioral: Negative for hallucinations, dysphoric mood and agitation.       Objective:   Physical Exam  Constitutional: She is oriented to person, place, and time. She appears well-developed and well-nourished. No distress.  HENT:  Head: Normocephalic and atraumatic.  Eyes: Conjunctivae are normal.  Neck: Neck supple.  Abdominal: Soft. She exhibits no distension and no mass.       Incision is well healed.  Small amount of yeast under pannus.  Genitourinary: Vagina normal and uterus normal. No breast swelling, tenderness or discharge.  Musculoskeletal: Normal range of motion.  Neurological: She is alert and oriented to person, place, and time.  Skin: Skin is warm and dry.  Psychiatric: She has a normal mood and affect. Her behavior is normal. Thought content normal.          Assessment & Plan:  Assessment: Patient is a 30 year old female for postpartum exam.  Plan: 1. Reglan to help improve her milk supply. 2. Lotramin AF to area on the abdomen under pannus.  That area did not improve and patient is to call us so we can evaluate for bacterial infection. 3.  Contact  us with any signs of depression.

## 2011-05-10 ENCOUNTER — Ambulatory Visit (HOSPITAL_COMMUNITY)
Admission: RE | Admit: 2011-05-10 | Discharge: 2011-05-10 | Disposition: A | Discharge status: Home / Self Care | Payer: BC Managed Care – PPO | Source: Ambulatory Visit | Attending: Obstetrics & Gynecology | Admitting: Obstetrics & Gynecology

## 2011-05-10 NOTE — Progress Notes (Signed)
Adult Lactation Consultation Outpatient Visit Note  Patient Name: Tara Rocha        BABY:  ZOE Riddles Date of Birth: 12-Feb-1981                      DOB  [birth year]   BIRTH WEIGHT:  7-4  WEIGHT TODAY:  8-15.7 Gestational Age at Delivery: Unknown Type of Delivery:   Breastfeeding History: Frequency of Breastfeeding: 2.5-3 HOURS Length of Feeding: 10 MIN Voids: 8+ Stools: 8 YELLOW  Supplementing / Method :2OZ EBM/FORMULA PER BOTTLE PC Pumping:  Type of Pump:PIS   Frequency:8 TIMES/24 HOURS AFTER FEEDS  Volume: 15-30CCS   Comments:PATIENT TAKING FENUGREEK AND ALSO STARTED REGLAN 1 WEEK AGO.  4 HOURS SINCE LAST FEEDING.  BREASTS VERY FULL.  BABY LATCHED EASILY AND NURSED FOR 20 MIN ON RIGHT BREAST.  PATIENT SHOWN HOW TO USE MORE BREAST MASSAGE FOR INCREASED FLOW.  BABY THEN NURSED 20 MIN ON LEFT BREAST.  MOM VERY PLEASED WITH 3.2 OZ PC WEIGHT GAIN.  PLAN IS TO FEED ON DEMAND BUT CONTINUE PUMPING PC.  OFFER EBM PRN.  NO FORMULA NEEDED.  RECOMMENDED WEIGHT CHECK IN 1 WEEK.    Consultation Evaluation:  Initial Feeding Assessment: Pre-feed [id number] Post-feed [id number]:3086 Amount Transferred:92 MLS Comments:  Additional Feeding Assessment: Pre-feed Weight: Post-feed Weight: Amount Transferred: Comments:  Additional Feeding Assessment: Pre-feed Weight: Post-feed Weight: Amount Transferred: Comments:  Total Breast milk Transferred this Visit:  Total Supplement Given:   Additional Interventions:   Follow-Up 1WEEK WEIGHT CHECK IN [facility] OR PEDI OFFICE      Hansel Feinstein 05/10/2011, 3:52 PM

## 2011-05-19 ENCOUNTER — Ambulatory Visit (HOSPITAL_COMMUNITY)
Admission: RE | Admit: 2011-05-19 | Discharge: 2011-05-19 | Disposition: A | Discharge status: Home / Self Care | Payer: BC Managed Care – PPO | Source: Ambulatory Visit | Attending: Pediatrics | Admitting: Pediatrics

## 2011-05-19 NOTE — Progress Notes (Addendum)
Infant Lactation Consultation Outpatient Visit Note  Patient Name: Tara Rocha Date of Birth: 10/10/1980 Birth Weight:   Gestational Age at Delivery: Gestational Age: <None> Type of Delivery:   Breastfeeding History Frequency of Breastfeeding: Every 4 hours  Length of Feeding: On and off for 15 minutes Voids: 6 to 10 Stools: every other day, but stools a lot on that day - yellow seedy stools  Supplementing / Method: Pumping:  Type of Pump: Medela Advanced    Frequency:After every feeding  Volume:  1 oz after feeding - in place of feeding get 3 oz (but last week was getting 4)  Comments:    Consultation Evaluation: Tara Rocha was eager rooting for the breast as we undressed and weighed.  Mom's breasts were soft and it had been 3 to 4 hours since last feeding.  Mom is taking Fenugreek mixture as well as Reglan (3 times a day - did not know how many mg she was taking).  Mom seems to think it was the Reglan that had boosted up her milk supply the Tuesday before when she came in for out patient consult.  She weighed 8lbs 15oz at that consult, which is only a 2 to 3 oz weight gain in 9 days.  At that consult Tara Rocha took 4 oz from the breast and was satiated after the feeding.  Mom had cut back her breastfeedings from every 2 to 3 hours to every 4 hours.  She reports that everything seemed fine until went to aerobics class on Monday.  Baby has not seemed satisfied since then.  She also went a long time separated from Tara Rocha and did not pump after any feedings.  Tara Rocha went to right breast without difficulty, but as soon as let down was over and milk flow slowed down she was on and off breast.  Massaging the breast helped for a few more minutes, before she became aggitated again.  She was passing a lot of gas and ended up having a moderate size bowel movement.  Mom has cut out dairy from her diet and that seems to have helped a little with the gas.  We changed the pamper.  She refused to maintain latch and keep  sucking on the right breast again, so we switched her to the left breast.  Same scenario happened with going on and sucking vigorously for less than five minutes and then refused to go back to the left breast as well.  She was gassy on the second breast as well and ended up having another large bowel movement.  Mom pumped and got almost an oz.  Mom gave that in a bottle along with another 1 oz bottle of expressed breastmilk that she brought with her.  Mom pumped again after feeding bottle and got another oz.  Initial Feeding Assessment: Pre-feed Weight:9lbs 1.5oz (4122) Post-feed Weight:9lbs 2.5oz (4152) Amount Transferred:30 Comments:Tara Rocha started fussing 10 minutes into feeding on right breast and started coming on and off breast (had BM)  Additional Feeding Assessment: Pre-feed Weight:9lbs 1.5oz (4122) Post-feed Weight:9lbs 2.1 (4144) Amount Transferred:22 Comments: Really upset on left breast feeding less than 5 minutes before coming off arching and crying uncontrollably  Additional Feeding Assessment: Pre-feed Weight: Post-feed Weight: Amount Transferred: Comments:  Total Breast milk Transferred this Visit: 52ml Total Supplement Given: Mom fed another 1 1/2 to 2oz expressed breastmilk via bottle before calmed down  Additional Interventions: Mom extremely dedicated to continue with breastfeeding for as long as she can no matter what it  takes.  She does not plan to go back to any more hard work outs, but instead plans to just walk for exercise.  She is committed to going back to breastfeeding every 2 to 3 hours with maybe a 3 hr stretch at night and pumping after every feeding that she can especially during the daytime hours.  She will continue taking fenugreek and Reglan and may consider Domperidone if needed.  Follow-Up  Mom does not want follow up appointment at present, but will call with any problems, concerns of if feels like another out patient lactation consult or weight check is  needed.     Louis Meckel 05/19/2011, 1:01 PM

## 2011-06-24 ENCOUNTER — Ambulatory Visit (INDEPENDENT_AMBULATORY_CARE_PROVIDER_SITE_OTHER): Payer: BC Managed Care – PPO | Admitting: Physician Assistant

## 2011-06-24 VITALS — BP 121/84 | HR 82 | Resp 17 | Ht 65.0 in | Wt 267.0 lb

## 2011-06-24 DIAGNOSIS — Z3009 Encounter for other general counseling and advice on contraception: Secondary | ICD-10-CM

## 2011-06-24 DIAGNOSIS — Z30430 Encounter for insertion of intrauterine contraceptive device: Secondary | ICD-10-CM

## 2011-06-24 MED ORDER — PARAGARD INTRAUTERINE COPPER IU IUD
INTRAUTERINE_SYSTEM | Freq: Once | INTRAUTERINE | Status: AC
  Administered 2011-06-24: 10:00:00 via INTRAUTERINE

## 2011-06-24 NOTE — Patient Instructions (Signed)
Contraceptive Devices (IUDs) IUD stands for intrauterine device. Intrauterine means inside the womb (uterus). The purpose of the IUD is to prevent pregnancy. IUDs make it more difficult for your partner's sperm to get into your womb and into your fallopian tubes, where the eggs are fertilized. IUDs also alter the secretions of your cervix, which make it a stronger sperm barrier. They also affect the lining of the womb, so it is harder for an egg to implant. The IUD does not cause an abortion. There are 2 types of IUDs available:  Copper IUD gives off a small amount of copper inside the uterus. This prevents the sperm from going through the uterus, up into the fallopian tube, where the egg is fertilized. The copper IUD can also damage or prevent the fertilized egg from attaching on the inside lining of the uterus. It can stay in place for 10 years. The copper IUD can be used as an emergency contraceptive, if inserted within 5 days after having unprotected sexual intercourse.   Hormone IUD contains progestin (synthetic progesterone), and it releases this hormone into the uterus. The hormone thickens the mucus on the cervix and prevents sperm from entering the uterus. It also weakens the sperm that get into the uterus, so that the sperm and egg cannot live in the fallopian tube. It also makes the inside lining of the uterus thinner, which makes it difficult for a fertilized egg to attach to the uterus. The hormone progestin in the IUD decreases the amount of bleeding during a menstrual period and can be helpful in women who have heavy menstrual periods. The hormone IUD can stay in place for 5 years.  SIDE EFFECTS OF THE IUD  There may be more cramping or pain with periods.   It may cause heavier, longer periods, which can cause lack of red blood cells (anemia) and can interfere with your daily and sexual activities.  This method of birth control is not usually the best choice for a woman with heavy or  prolonged periods. The birth control pill may be a better choice. IUDs work best for women who have already had a pregnancy, because the cervix is more open, making the insertion of the device easier and less painful. However, many women without children use the IUD. One of the main goals of patient selection is to prevent unintentionally inserting an IUD into a patient who has an STD (sexually transmitted disease), who is at high risk of exposure to an STD, or who is already pregnant. That is why the IUD is inserted during, or right after, a menstrual period. REASONS NOT TO USE AN IUD  The womb or cervix is not shaped normally.   You have or have had a pelvic infection, such as an STD, in the past 3 months.   You have or suspect cancer in the female organs.   You have an abnormal Pap smear.   You have certain liver diseases.   There is severe infection or inflammation of the cervix (cervicitis).   You have unexplained vaginal bleeding.   You have heart valve problems (unless a heart specialist advises otherwise).   You are allergic to copper (rare).   You previously had a pregnancy outside the uterus (ectopic).   You are pregnant or suspect you are pregnant.   You have prolonged or heavy periods, or heavy pain or cramping with periods (except for the hormone IUD).   You have or suspect pelvic cancer.   You have an  STD.   The cervix or uterus has problems (cervical stenosis, fibroids in the uterus) making it difficult to insert the IUD.  IUDs should be removed when a woman becomes menopausal or pregnant. BENEFITS  You are always ready to have sexual intercourse.   The copper IUD does not interfere with your female hormones.   The copper IUD can be used as emergency contraception.   An IUD can be used while nursing.   It works immediately after insertion, and there is no problem getting pregnant when it is removed.   It does not interfere with foreplay.   The  progesterone IUD can make heavy menstrual periods lighter.   The progesterone IUD can be used for 5 years.   The copper IUD can be used for 10 years.  RISKS  Putting the IUD through the uterus, into the pelvis or abdomen (perforation of the uterus).   Losing the IUD (expulsion). This is more common in women who never had children.   When pregnant with an IUD, there is an increased chance of an infection and loss of the pregnancy.   Pregnancy in the fallopian tube (ectopic).   STD, in women who have more than 1 sex partner. The IUD does not protect against STDs.   Other minor side effects may include:   Headaches.   Feeling sick to your stomach (nausea).   Breast tenderness.   Depression.  PROCEDURE  The IUD is inserted during or right after a menstrual period, to make sure you are not pregnant.   You will be given a consent form to read about the IUD, how it works, how it is inserted, and the side effects.   You will lie on an exam table, naked from the waist down.   Your caregiver will do an exam to determine the size and position of your uterus.   Usually, an anesthetic is not needed.   Your caregiver may give you a pain pill to take, 1 or 2 hours before the procedure.   Sometimes, a paracervical block may be used to block and control any discomfort with insertion.   A tool (speculum) is then placed in your vagina (birth canal) so your caregiver can see the cervix.   A sound is sent into the uterus to check the depth of the uterus.   A slim instrument (IUD inserter), which is shaped like a drinking straw, is inserted through the small opening in your cervix and into your uterus.   Then, the IUD is pushed in with a plunger, much like a syringe, and the inserter is removed. There may be some cramping and pain during the insertion.   Relaxing helps to lessen the discomfort.   Following the procedure, you will usually spot blood. Have some pads with you. Avoid using  tampons for 2 weeks. Bleeding after the procedure is normal. It varies from light spotting for a few days to menstrual-like bleeding for up to 3 weeks. It may be like a period if it is near the time for your normal period.   You may also have mild cramping. Only take over-the-counter or prescription medicines for pain, discomfort, or fever as directed by your caregiver. Do not use aspirin, as this may increase bleeding.   Practice checking the string coming out of the cervix, to make sure the IUD is always in the uterus.  HOME CARE INSTRUCTIONS  Do not drive for 24 hours.   Only take over-the-counter or prescription medicines for  pain, discomfort, or fever as directed by your caregiver.   No tampons, douching, or sexual intercourse for 2 weeks, or until your caregiver approves.   Rest at home for 24 hours. You may then resume normal activities, unless told otherwise by your caregiver.   Check your IUD prior to resuming sexual activity, to make sure it is in place. Make sure that you can feel the strings. An IUD can be pushed out and lost without the user even knowing it is gone. Also, if the strings are getting longer, it may mean that the IUD is being forced out of the uterus. This means it is no longer offering full protection from pregnancy.   Take any medications your caregiver has ordered, as directed.   Make sure to keep your recheck appointment, so your caregiver can make sure your IUD has remained in place. After that exam, yearly exams are advised, unless you cannot feel the strings of your IUD.   Check that the IUD is still in place by feeling for the strings after every menstrual period.  SEEK MEDICAL CARE IF:  Bleeding is heavier than a normal menstrual cycle.   You have an oral temperature above 100.5.   You have increasing cramps or pains, not relieved with medicine. Or you develop belly (abdominal) pain that does not seem to be related to the same area of earlier cramping  and pain.   You are lightheaded, unusually weak, or have fainting episodes.   You develop pain in the tops of your shoulders (shoulder strap areas).   You are having problems or questions, which have not been answered well enough by your caregiver.   You develop abdominal pain.   You have pain during sexual intercourse.   You cannot feel the IUD strings.   You have abnormal vaginal discharge.   You feel the IUD at the opening of the cervix in the vagina.   You think you are pregnant.   You miss your menstrual period.   The IUD string is hurting your sex partner.   The IUD string has gotten longer.  Document Released: 08/16/2004 Document Re-Released: 12/07/2009 [facility] Patient Information 2011 [place], [place].

## 2011-06-24 NOTE — Progress Notes (Signed)
Addended by: Granville Lewis on: 06/24/2011 10:14 AM   Modules accepted: Orders

## 2011-06-24 NOTE — Progress Notes (Signed)
Patient identified, informed consent performed, signed copy in chart, time out was performed.  Urine pregnancy test negative.  Speculum placed in the vagina.  Cervix visualized.  Cleaned with Betadine x 2.  Grasped anteriourly with a single tooth tenaculum.  Uterus sounded to 8cm.  Paragard IUD placed per manufacturer's recommendations.  Strings trimmed to 1.5 cm.   Patient given post procedure instructions and Paragard care card with expiration date.  Patient is asked to check IUD strings periodically and follow up in 5-6 weeks for IUD check.   Marivel Mcclarty E. 06/24/2011 .

## 2011-07-11 LAB — RPR: RPR Ser Ql: NONREACTIVE

## 2011-07-11 LAB — CBC
HCT: 30.5 — ABNORMAL LOW
HCT: 39
MCV: 91.3
Platelets: 270
RBC: 3.35 — ABNORMAL LOW
RBC: 4.34
RDW: 13.4
RDW: 13.8
WBC: 14 — ABNORMAL HIGH

## 2011-07-20 DIAGNOSIS — F329 Major depressive disorder, single episode, unspecified: Secondary | ICD-10-CM | POA: Insufficient documentation

## 2011-08-05 ENCOUNTER — Ambulatory Visit: Payer: BC Managed Care – PPO

## 2011-08-12 ENCOUNTER — Other Ambulatory Visit: Payer: Self-pay | Admitting: Family

## 2011-08-12 ENCOUNTER — Ambulatory Visit (INDEPENDENT_AMBULATORY_CARE_PROVIDER_SITE_OTHER): Payer: BC Managed Care – PPO | Admitting: Family

## 2011-08-12 VITALS — BP 123/81 | HR 83 | Resp 16 | Ht 65.0 in | Wt 265.0 lb

## 2011-08-12 DIAGNOSIS — Z30431 Encounter for routine checking of intrauterine contraceptive device: Secondary | ICD-10-CM

## 2011-08-12 DIAGNOSIS — Z3040 Encounter for surveillance of contraceptives, unspecified: Secondary | ICD-10-CM

## 2011-08-12 NOTE — Progress Notes (Signed)
  Subjective:    Patient ID: Tara Rocha, female    DOB: [birth year], 30 y.o.   MRN: [id number]  HPI Pt here for IUD string check.  Paraguard placed; reports heavy cycles x 2; using super tampon every 4 hours.  Denies abnormal vaginal discharge.   Review of Systems Heavy menses    Objective:   Physical Exam  Constitutional: She is oriented to person, place, and time. She appears well-developed and well-nourished.  HENT:  Head: Normocephalic.  Neck: Normal range of motion. Neck supple.  Abdominal: Soft. There is no tenderness.  Genitourinary: Cervix exhibits no motion tenderness, no discharge and no friability. Vaginal discharge (white, frothy discharge) found.       IUD strings visualized; normal length; apparatus not seen  Neurological: She is alert and oriented to person, place, and time.  Skin: Skin is warm and dry.          Assessment & Plan:  IUD Surveillance Vaginal Discharge  Plan: Wet prep Pt plans to continue with Paraguard for a few more cycles; considering switching to Mirena.  [facility]

## 2011-08-12 NOTE — Progress Notes (Signed)
Addended by: Granville Lewis on: 08/12/2011 11:45 AM   Modules accepted: Orders

## 2011-08-13 LAB — WET PREP FOR TRICH, YEAST, CLUE

## 2011-08-13 LAB — WET PREP, GENITAL
Clue Cells Wet Prep HPF POC: NONE SEEN
Trich, Wet Prep: NONE SEEN
Yeast Wet Prep HPF POC: NONE SEEN

## 2011-08-27 DIAGNOSIS — Z8619 Personal history of other infectious and parasitic diseases: Secondary | ICD-10-CM

## 2011-08-27 HISTORY — DX: Personal history of other infectious and parasitic diseases: Z86.19

## 2011-08-27 HISTORY — PX: LEG SKIN LESION  BIOPSY / EXCISION: SUR473

## 2012-03-27 ENCOUNTER — Ambulatory Visit: Payer: BC Managed Care – PPO | Admitting: Obstetrics & Gynecology

## 2012-04-12 ENCOUNTER — Ambulatory Visit: Payer: BC Managed Care – PPO | Admitting: Obstetrics & Gynecology

## 2012-04-24 ENCOUNTER — Ambulatory Visit: Payer: BC Managed Care – PPO | Admitting: Obstetrics & Gynecology

## 2012-05-01 ENCOUNTER — Encounter: Payer: Self-pay | Admitting: Obstetrics & Gynecology

## 2012-05-01 ENCOUNTER — Ambulatory Visit (INDEPENDENT_AMBULATORY_CARE_PROVIDER_SITE_OTHER): Payer: BC Managed Care – PPO | Admitting: Obstetrics & Gynecology

## 2012-05-01 VITALS — BP 139/92 | HR 84 | Temp 98.0°F | Resp 17 | Ht 65.0 in | Wt 215.0 lb

## 2012-05-01 DIAGNOSIS — Z124 Encounter for screening for malignant neoplasm of cervix: Secondary | ICD-10-CM

## 2012-05-01 DIAGNOSIS — Z0000 Encounter for general adult medical examination without abnormal findings: Secondary | ICD-10-CM

## 2012-05-01 DIAGNOSIS — Z113 Encounter for screening for infections with a predominantly sexual mode of transmission: Secondary | ICD-10-CM

## 2012-05-01 NOTE — Progress Notes (Signed)
Subjective:    Tara Rocha is a 31 y.o. female who presents for an annual exam. The patient has no complaints today. The patient is sexually active. GYN screening history: last pap: was normal. The patient wears seatbelts: yes. The patient participates in regular exercise: yes. Has the patient ever been transfused or tattooed?: no. The patient reports that there is not domestic violence in her life.   Menstrual History: OB History    Grav Para Term Preterm Abortions TAB SAB Ect Mult Living   3 2 2  1  1   2       Menarche age: 81 No LMP recorded. Patient is not currently having periods (Reason: Other).    The following portions of the patient's history were reviewed and updated as appropriate: allergies, current medications, past family history, past medical history, past social history, past surgical history and problem list.  Review of Systems A comprehensive review of systems was negative.    Objective:    BP 139/92  Pulse 84  Temp 98 F (36.7 C) (Oral)  Resp 17  Ht 5\' 5"  (1.651 m)  Wt 215 lb (97.523 kg)  BMI 35.78 kg/m2  Breastfeeding? No  General Appearance:    Alert, cooperative, no distress, appears stated age  Head:    Normocephalic, without obvious abnormality, atraumatic  Eyes:    PERRL, conjunctiva/corneas clear, EOM's intact, fundi    benign, both eyes  Ears:    Normal TM's and external ear canals, both ears  Nose:   Nares normal, septum midline, mucosa normal, no drainage    or sinus tenderness  Throat:   Lips, mucosa, and tongue normal; teeth and gums normal  Neck:   Supple, symmetrical, trachea midline, no adenopathy;    thyroid:  no enlargement/tenderness/nodules; no carotid   bruit or JVD  Back:     Symmetric, no curvature, ROM normal, no CVA tenderness  Lungs:     Clear to auscultation bilaterally, respirations unlabored  Chest Wall:    No tenderness or deformity   Heart:    Regular rate and rhythm, S1 and S2 normal, no murmur, rub   or gallop  Breast  Exam:    No tenderness, masses, or nipple abnormality  Abdomen:     Soft, non-tender, bowel sounds active all four quadrants,    no masses, no organomegaly  Genitalia:    Normal female without lesion, discharge or tenderness, NSSA, NT, no adnexal masses     Extremities:   Extremities normal, atraumatic, no cyanosis or edema  Pulses:   2+ and symmetric all extremities  Skin:   Skin color, texture, turgor normal, no rashes or lesions  Lymph nodes:   Cervical, supraclavicular, and axillary nodes normal  Neurologic:   CNII-XII intact, normal strength, sensation and reflexes    throughout  .    Assessment:    Healthy female exam.    Plan:     Pap smear.

## 2012-09-14 ENCOUNTER — Ambulatory Visit: Payer: BC Managed Care – PPO | Admitting: Advanced Practice Midwife

## 2012-11-27 ENCOUNTER — Ambulatory Visit (INDEPENDENT_AMBULATORY_CARE_PROVIDER_SITE_OTHER): Payer: BC Managed Care – PPO | Admitting: Obstetrics & Gynecology

## 2012-11-27 ENCOUNTER — Encounter: Payer: Self-pay | Admitting: Obstetrics & Gynecology

## 2012-11-27 VITALS — BP 112/74 | HR 75 | Resp 16 | Ht 64.0 in | Wt 174.0 lb

## 2012-11-27 DIAGNOSIS — N949 Unspecified condition associated with female genital organs and menstrual cycle: Secondary | ICD-10-CM

## 2012-11-27 DIAGNOSIS — R102 Pelvic and perineal pain: Secondary | ICD-10-CM

## 2012-11-27 MED ORDER — LIDOCAINE 5 % EX OINT: TOPICAL_OINTMENT | CUTANEOUS | Status: DC | PRN

## 2012-11-27 NOTE — Progress Notes (Addendum)
  Subjective:    Patient ID: Tara Rocha, female    DOB: [birth year], 32 y.o.   MRN: [id number]  HPI  Pt presents with 2 months history of painful intercourse.  Pain is on insertion--feel swollen and like sand paper.  Pt has been using lubricant which has not helped much.  Pain is worse in missionary position.  No pain with tight jeans or bike riding.  Pain does not occur with every sexual encouter, but with about 1/2.  No bleeding (other than heavy menses with paraguard), no discharge.  IUD for birth control.  Pt has complaint of bulge on left anterior thigh midline 2 cm below inguinal region.  Review of Systems  Constitutional: Negative.   Respiratory: Negative.   Cardiovascular: Negative.   Gastrointestinal: Positive for abdominal pain.  Genitourinary: Positive for pelvic pain and dyspareunia.       Objective:   Physical Exam  Vitals reviewed. Constitutional: She is oriented to person, place, and time. She appears well-developed and well-nourished. No distress.  HENT:  Head: Normocephalic and atraumatic.  Eyes: Conjunctivae are normal.  Pulmonary/Chest: Effort normal.  Abdominal: Soft. She exhibits no distension and no mass. There is no tenderness. There is no rebound and no guarding.  Genitourinary:  Pain at vestibular glands, Pain over ischial spines, mild CMT, Pain with deep palpation over entire pelvis.  NO mass felt but exam difficult.  Increased vaginal discharge.  IUD strings seen.  Musculoskeletal:  1-2 cm mass on left anterior thigh.  Doesn't increase with valsalva  Neurological: She is alert and oriented to person, place, and time.  Skin: Skin is dry.  Psychiatric: She has a normal mood and affect.  Right inner thigh--folliculitis; Pt states it is getting better        Assessment & Plan:  32 yo female with ;elvic pain and vulvar pain for 2 months  1-pelvic US 2-lidocaine ointment prn 3-lubricant 4-vulvar hygiene reviewed 5-referral to general surgery for  thigh mass 6-If folliculitis worsens will call in doxy 100 mg bid for 2 weeks 7-Pelvic PT at [facility] urology is next step in treatment  Addendum: Wet Prep shows BV and yeast.  Will treat and re evaluate

## 2012-11-28 ENCOUNTER — Telehealth: Payer: Self-pay | Admitting: *Deleted

## 2012-11-28 LAB — GC/CHLAMYDIA PROBE AMP, GENITAL
Chlamydia, DNA Probe: NEGATIVE
GC Probe Amp, Genital: NEGATIVE

## 2012-11-28 MED ORDER — METRONIDAZOLE 500 MG PO TABS: 500.0000 mg | ORAL_TABLET | Freq: Two times a day (BID) | ORAL | Status: DC

## 2012-11-28 MED ORDER — FLUCONAZOLE 150 MG PO TABS: ORAL_TABLET | ORAL | Status: DC

## 2012-11-28 NOTE — Addendum Note (Signed)
Addended by: Lesly Dukes on: 11/28/2012 10:04 AM   Modules accepted: Orders

## 2012-11-28 NOTE — Telephone Encounter (Signed)
Called pt to adv meds at pharmacy [facility] for her to call me back

## 2012-11-29 ENCOUNTER — Other Ambulatory Visit: Payer: BC Managed Care – PPO

## 2012-11-29 ENCOUNTER — Telehealth: Payer: Self-pay | Admitting: *Deleted

## 2012-11-29 DIAGNOSIS — L739 Follicular disorder, unspecified: Secondary | ICD-10-CM

## 2012-11-29 MED ORDER — DOXYCYCLINE HYCLATE 100 MG PO TABS: ORAL_TABLET | ORAL | Status: DC

## 2012-11-29 NOTE — Telephone Encounter (Signed)
Pt called requesting antibiotic for folliculitis on her inner thigh.  Per Dr leggett's office note 11/27/12 she may have a RX for Doxycycline called to [facility].  Pt aware.

## 2012-12-03 ENCOUNTER — Other Ambulatory Visit: Payer: BC Managed Care – PPO

## 2012-12-04 ENCOUNTER — Telehealth: Payer: Self-pay

## 2012-12-04 NOTE — Telephone Encounter (Signed)
Pt is scheduled to see general surgeon Dr. Dwain Sarna at [facility] [address] [phone] on March 27th at 1:20pm.

## 2012-12-11 ENCOUNTER — Ambulatory Visit (INDEPENDENT_AMBULATORY_CARE_PROVIDER_SITE_OTHER): Payer: BC Managed Care – PPO

## 2012-12-11 ENCOUNTER — Telehealth: Payer: Self-pay | Admitting: *Deleted

## 2012-12-11 DIAGNOSIS — R102 Pelvic and perineal pain: Secondary | ICD-10-CM

## 2012-12-11 DIAGNOSIS — T8339XA Other mechanical complication of intrauterine contraceptive device, initial encounter: Secondary | ICD-10-CM

## 2012-12-11 DIAGNOSIS — Y849 Medical procedure, unspecified as the cause of abnormal reaction of the patient, or of later complication, without mention of misadventure at the time of the procedure: Secondary | ICD-10-CM

## 2012-12-12 ENCOUNTER — Telehealth: Payer: Self-pay | Admitting: *Deleted

## 2012-12-12 NOTE — Telephone Encounter (Signed)
Called pt LMOM need to schedule appt with Dr Penne Lash to go over pelvic u/s results

## 2012-12-12 NOTE — Telephone Encounter (Signed)
Need to schedule patient for consult about IUD with Dr. Penne Lash

## 2012-12-12 NOTE — Telephone Encounter (Signed)
Spoke to pt about u/s results and IUD being malpositioned. I told her she would need to come in to speak with Dr Penne Lash about what the options are as far as removing this IUD and replacing it with some other type of Birth control. She adv does not do well with hormones and does not want Mirena. I told her she could ask Dr Penne Lash what other options were out there when she came in - Pt adv also needs a follow up anyway so she will do the follow up and talk about IUD at the same time. I transferred the call to [facility] to schedule appointment.

## 2012-12-13 ENCOUNTER — Encounter: Payer: Self-pay | Admitting: Obstetrics & Gynecology

## 2012-12-13 DIAGNOSIS — T8332XA Displacement of intrauterine contraceptive device, initial encounter: Secondary | ICD-10-CM | POA: Insufficient documentation

## 2012-12-20 ENCOUNTER — Ambulatory Visit (INDEPENDENT_AMBULATORY_CARE_PROVIDER_SITE_OTHER): Payer: BC Managed Care – PPO | Admitting: General Surgery

## 2012-12-20 ENCOUNTER — Encounter (INDEPENDENT_AMBULATORY_CARE_PROVIDER_SITE_OTHER): Payer: Self-pay | Admitting: General Surgery

## 2012-12-20 VITALS — BP 102/70 | HR 68 | Temp 98.4°F | Resp 16 | Ht 65.0 in | Wt 169.0 lb

## 2012-12-20 DIAGNOSIS — R229 Localized swelling, mass and lump, unspecified: Secondary | ICD-10-CM

## 2012-12-20 DIAGNOSIS — R2242 Localized swelling, mass and lump, left lower limb: Secondary | ICD-10-CM

## 2012-12-20 NOTE — Progress Notes (Signed)
Patient ID: Tara Rocha, female   DOB: [birth year], 32 y.o.   MRN: [id number]  Chief Complaint  Patient presents with  . Mass    thigh    HPI Tara Rocha is a 32 y.o. female.  Referred by Dr Elsie Lincoln HPI This is a 32 year old female who has a history of a left thigh abscess that was drained. She over the last month has had a left thigh mass that has been present. This does not really bother her. She notices it more when she is standing up. She has no other symptoms of any changes with bowel movements. There is really no other symptoms except that the mass is fair. She comes in today for evaluation. Past Medical History  Diagnosis Date  . Poor circulation     Past Surgical History  Procedure Laterality Date  . Tonsillectomy      age 29  . Cesarean section  2008, 2012    Family History  Problem Relation Age of Onset  . Anemia Mother   . Cancer Paternal Grandmother     liver  . Thyroid disease Maternal Grandmother     Social History History  Substance Use Topics  . Smoking status: Never Smoker   . Smokeless tobacco: Never Used  . Alcohol Use: No    No Known Allergies  Current Outpatient Prescriptions  Medication Sig Dispense Refill  . Biotin 1000 MCG tablet Take 1,000 mcg by mouth daily.      . ferrous sulfate 325 (65 FE) MG tablet Take 325 mg by mouth daily with breakfast.      . IUD's (PARAGARD INTRAUTERINE COPPER) IUD IUD 1 each by Intrauterine route once.      . lidocaine (XYLOCAINE) 5 % ointment Apply topically as needed.  35.44 g  1  . Multiple Vitamin (MULTIVITAMIN) tablet Take 1 tablet by mouth daily.      . Zinc 15 MG CAPS Take by mouth daily.      . cholecalciferol (VITAMIN D) 1000 UNITS tablet Take 2,000 Units by mouth daily.       No current facility-administered medications for this visit.    Review of Systems Review of Systems  Constitutional: Negative for fever, chills and unexpected weight change.  HENT: Negative for hearing loss,  congestion, sore throat, trouble swallowing and voice change.   Eyes: Negative for visual disturbance.  Respiratory: Negative for cough and wheezing.   Cardiovascular: Negative for chest pain, palpitations and leg swelling.  Gastrointestinal: Negative for nausea, vomiting, abdominal pain, diarrhea, constipation, blood in stool, abdominal distention and anal bleeding.  Genitourinary: Negative for hematuria, vaginal bleeding and difficulty urinating.  Musculoskeletal: Negative for arthralgias.  Skin: Negative for rash and wound.  Neurological: Negative for seizures, syncope and headaches.  Hematological: Negative for adenopathy. Bruises/bleeds easily.  Psychiatric/Behavioral: Negative for confusion.    Blood pressure 102/70, pulse 68, temperature 98.4 F (36.9 C), resp. rate 16, height 5\' 5"  (1.651 m), weight 169 lb (76.658 kg), last menstrual period 11/30/2012.  Physical Exam Physical Exam  Constitutional: She appears well-developed and well-nourished.  Abdominal: Hernia confirmed negative in the left inguinal area.  Musculoskeletal:       Legs:   Data Reviewed Dr. Betha Loa notes  Assessment    Left thigh mass     Plan    I don't think this is a hernia on her examination today. Not sure exactly what this year he is. She has a significant amount of varicose veins right next to  this also. The best plan would be to have an ultrasound done and then have her return after that to review that.        Tara Rocha 12/20/2012, 1:59 PM

## 2013-01-03 ENCOUNTER — Encounter: Payer: Self-pay | Admitting: Obstetrics & Gynecology

## 2013-01-03 ENCOUNTER — Ambulatory Visit (INDEPENDENT_AMBULATORY_CARE_PROVIDER_SITE_OTHER): Payer: BC Managed Care – PPO | Admitting: Obstetrics & Gynecology

## 2013-01-03 VITALS — BP 110/68 | HR 56 | Resp 16 | Ht 65.0 in | Wt 171.0 lb

## 2013-01-03 DIAGNOSIS — N949 Unspecified condition associated with female genital organs and menstrual cycle: Secondary | ICD-10-CM

## 2013-01-03 DIAGNOSIS — Z30433 Encounter for removal and reinsertion of intrauterine contraceptive device: Secondary | ICD-10-CM

## 2013-01-03 DIAGNOSIS — T8332XA Displacement of intrauterine contraceptive device, initial encounter: Secondary | ICD-10-CM

## 2013-01-03 DIAGNOSIS — R102 Pelvic and perineal pain: Secondary | ICD-10-CM

## 2013-01-03 NOTE — Patient Instructions (Signed)
Intrauterine Device Insertion Care After Refer to this sheet in the next few weeks. These instructions provide you with information on caring for yourself after your procedure. Your caregiver may also give you more specific instructions. Your treatment has been planned according to current medical practices, but problems sometimes occur. Call your caregiver if you have any problems or questions after your procedure. HOME CARE INSTRUCTIONS   Only take over-the-counter or prescription medicines for pain, discomfort, or fever as directed by your caregiver. Do not use aspirin. This may increase bleeding.  Check your IUD to make sure it is in place before you resume sexual activity. You should be able to feel the strings. If you cannot feel the strings, something may be wrong. The IUD may have fallen out of the uterus, or the uterus may have been punctured (perforated) during placement. Also, if the strings are getting longer, it may mean that the IUD is being forced out of the uterus. You no longer have full protection from pregnancy if any of these problems occur.  You may resume sexual intercourse if you are not having problems with the IUD. The IUD is considered immediately effective.  You may resume normal activities.  Keep all follow-up appointments to be sure your IUD has remained in place. After the first exam, yearly exams are advised, unless you cannot feel the strings of your IUD.  Continue to check that the IUD is still in place by feeling for the strings after every menstrual period. SEEK MEDICAL CARE IF:   You have bleeding that is heavier or lasts longer than a normal menstrual cycle.  You have a fever.  You have increasing cramps or abdominal pain not relieved with medicine.  You have abdominal pain that does not seem to be related to the same area of earlier cramping and pain.  You are lightheaded, unusually weak, or faint.  You have abnormal vaginal discharge or  smells.  You have pain during sexual intercourse.  You cannot feel the IUD strings, or the IUD string has gotten longer.  You feel the IUD at the opening of the cervix in the vagina.  You think you are pregnant, or you miss your menstrual period.  The IUD string is hurting your sex partner. Document Released: 05/11/2011 Document Revised: 12/05/2011 Document Reviewed: 05/11/2011 Exit[employer] Patient Information 2013 [employer].  

## 2013-01-03 NOTE — Progress Notes (Signed)
  Subjective:    Patient ID: Tara Rocha, female    DOB: 13-Dec-1980, 32 y.o.   MRN: [id number]  HPI Pt's discharge and dysparenia and vaginal lubrication are much better after treatment for BV and yeast.  Pt still having some pelvic discomfert.  IUD is dislodged and penetrating myometrium.  Will remove today and replace.  Pt is following up with gen surg for left thigh mass.   Review of Systems  Genitourinary: Positive for pelvic pain. Negative for vaginal bleeding, vaginal discharge and vaginal pain.       Objective:   Physical Exam  Vitals reviewed. Constitutional: She appears well-developed and well-nourished.  HENT:  Head: Normocephalic and atraumatic.  Abdominal: Soft. She exhibits no distension.  Genitourinary: Vagina normal and uterus normal.          Assessment & Plan:  IUD removeal Patient identified, informed consent performed, signed copy in chart, time out was performed.  IUD strings were grasped with a ring forcep and the IUD was removed easily.  The IUD was intact.  IUD Procedure Note Patient identified, informed consent performed, signed copy in chart, time out was performed.  Speculum placed in the vagina.  Cervix visualized.  Cleaned with Betadine x 2.  Grasped anteriourly with a single tooth tenaculum.  Uterus sounded to 7.5.  Mirena IUD placed per manufacturer's recommendations.  Strings trimmed to 3 cm.   Patient given post procedure instructions and Mirena care card with expiration date.  Patient is asked to check IUD strings periodically and follow up in 4-6 weeks for IUD check.

## 2013-01-04 ENCOUNTER — Ambulatory Visit (INDEPENDENT_AMBULATORY_CARE_PROVIDER_SITE_OTHER): Payer: BC Managed Care – PPO

## 2013-01-04 DIAGNOSIS — R229 Localized swelling, mass and lump, unspecified: Secondary | ICD-10-CM

## 2013-01-04 DIAGNOSIS — R2242 Localized swelling, mass and lump, left lower limb: Secondary | ICD-10-CM

## 2013-01-14 ENCOUNTER — Encounter (INDEPENDENT_AMBULATORY_CARE_PROVIDER_SITE_OTHER): Payer: Self-pay | Admitting: General Surgery

## 2013-01-23 ENCOUNTER — Telehealth (INDEPENDENT_AMBULATORY_CARE_PROVIDER_SITE_OTHER): Payer: Self-pay | Admitting: General Surgery

## 2013-01-23 ENCOUNTER — Telehealth (INDEPENDENT_AMBULATORY_CARE_PROVIDER_SITE_OTHER): Payer: Self-pay | Admitting: *Deleted

## 2013-01-23 NOTE — Telephone Encounter (Signed)
Patient called back and wanted to know if we could give her some vascular surgeon names and number. I gave her Dr Myra Gianotti and Dr Edwyna Shell names and numbers

## 2013-01-23 NOTE — Telephone Encounter (Signed)
Gave patient [place] results and suggested she be seen by vascular surgeon per Dwain Sarna MD.   Explained we would be happy to see her again however their was nothing surgical identified on the [place].

## 2013-02-13 ENCOUNTER — Ambulatory Visit: Payer: BC Managed Care – PPO | Admitting: Obstetrics & Gynecology

## 2013-02-19 ENCOUNTER — Ambulatory Visit: Payer: BC Managed Care – PPO | Admitting: Obstetrics & Gynecology

## 2013-02-20 ENCOUNTER — Ambulatory Visit: Payer: BC Managed Care – PPO | Admitting: Obstetrics & Gynecology

## 2013-02-27 ENCOUNTER — Encounter: Payer: Self-pay | Admitting: Vascular Surgery

## 2013-02-28 ENCOUNTER — Encounter (INDEPENDENT_AMBULATORY_CARE_PROVIDER_SITE_OTHER): Payer: BC Managed Care – PPO | Admitting: *Deleted

## 2013-02-28 ENCOUNTER — Encounter: Payer: Self-pay | Admitting: Vascular Surgery

## 2013-02-28 ENCOUNTER — Ambulatory Visit (INDEPENDENT_AMBULATORY_CARE_PROVIDER_SITE_OTHER): Payer: BC Managed Care – PPO | Admitting: Vascular Surgery

## 2013-02-28 VITALS — BP 100/60 | HR 61 | Resp 14 | Ht 65.0 in | Wt 164.0 lb

## 2013-02-28 DIAGNOSIS — I83893 Varicose veins of bilateral lower extremities with other complications: Secondary | ICD-10-CM

## 2013-02-28 NOTE — Progress Notes (Signed)
[facility] [place] HISTORY AND PHYSICAL   History of Present Illness:  Patient is a 32 y.o. year old female who presents for evaluation of varicose veins.  She was referred by Dr. Penne Lash.   Her pain has gotten progressively worse since the birth of her first child 5 years ago.  He legs feel achy and heavy after walking and sitting for extended period such as plane trips. She has no other contributing medical history.   No hypertension, DM or PAD.  Past Medical History  Diagnosis Date  . Poor circulation   . History of staph infection 08-2011    Required surgical excision (Left thigh)    Past Surgical History  Procedure Laterality Date  . Tonsillectomy      age 27  . Cesarean section  2008, 2012  . Leg skin lesion  biopsy / excision  08-2011    Left upper thigh skin excision for Staph Infection     Social History History  Substance Use Topics  . Smoking status: Never Smoker   . Smokeless tobacco: Never Used  . Alcohol Use: No    Family History Family History  Problem Relation Age of Onset  . Anemia Mother   . Other Mother     varicose veins  . Cancer Paternal Grandmother     liver  . Thyroid disease Maternal Grandmother   . Other Sister     varicose veins    Allergies  Allergies  Allergen Reactions  . Bactrim (Sulfamethoxazole W-Trimethoprim) Nausea And Vomiting     Current Outpatient Prescriptions  Medication Sig Dispense Refill  . Biotin 1000 MCG tablet Take 1,000 mcg by mouth daily.      . cholecalciferol (VITAMIN D) 1000 UNITS tablet Take 2,000 Units by mouth daily.      . ferrous sulfate 325 (65 FE) MG tablet Take 325 mg by mouth daily with breakfast.      . IUD's (PARAGARD INTRAUTERINE COPPER) IUD IUD 1 each by Intrauterine route once.      . lidocaine (XYLOCAINE) 5 % ointment Apply topically as needed.  35.44 g  1  . Multiple Vitamin (MULTIVITAMIN) tablet Take 1 tablet by mouth daily.      . Zinc 15 MG CAPS Take by mouth daily.        No current facility-administered medications for this visit.    ROS:   General:  No weight loss, Fever, chills  HEENT: No recent headaches, no nasal bleeding, no visual changes, no sore throat  Neurologic: No dizziness, blackouts, seizures. No recent symptoms of stroke or mini- stroke. No recent episodes of slurred speech, or temporary blindness.  Cardiac: No recent episodes of chest pain/pressure, no shortness of breath at rest.  No shortness of breath with exertion.  Denies history of atrial fibrillation or irregular heartbeat  Vascular: No history of rest pain in feet.  No history of claudication.  No history of non-healing ulcer, No history of DVT. Pulmonary: No home oxygen, no productive cough, no hemoptysis,  No asthma or wheezing  Musculoskeletal:  [ ]  Arthritis, [ ]  Low back pain,  [ ]  Joint pain  Hematologic:No history of hypercoagulable state.  No history of easy bleeding.  No history of anemia  Gastrointestinal: No hematochezia or melena,  No gastroesophageal reflux, no trouble swallowing  Urinary: [ ]  chronic Kidney disease, [ ]  on HD - [ ]  MWF or [ ]  TTHS, [ ]  Burning with urination, [ ]  Frequent urination, [ ]   Difficulty urinating;   Skin: No rashes  Psychological: No history of anxiety,  No history of depression   Physical Examination  Filed Vitals:   02/28/13 1113  BP: 100/60  Pulse: 61  Resp: 14  Height: 5\' 5"  (1.651 m)  Weight: 164 lb (74.39 kg)    Body mass index is 27.29 kg/(m^2).  General:  Alert and oriented, no acute distress HEENT: Normal Neck: No bruit or JVD Pulmonary: Clear to auscultation bilaterally Cardiac: Regular Rate and Rhythm without murmur Abdomen: Soft, non-tender, non-distended, no mass, no scars Skin: No rash Extremity Pulses:  2+ radial, brachial, femoral, dorsalis pedis, posterior tibial pulses bilaterally. varicose veins left greater than right LE.  Most of these are 4-6 mm in diameter.  There is a large varicosity at the  left saphenofemoral junction which is quite pronounced with standing all of her varicosities are More pronounced with standing and tender to compression.     Musculoskeletal: No deformity or edema  Neurologic: Upper and lower extremity motor 5/5 and symmetric  DATA: Venous duplex bil. LE No DVT Deep venous reflux, left small saphenous vein not competent Significant reflux noted right and left anterior saphenous veins.   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 Arbie Cookey for evaluation and treatment plan and consideration for laser ablation in 3 months  Lars Mage [facility] [place] Office: [phone]  History and exam details as above.  Patient has severe symptomatic varicose veins and will be considered for laser ablation if she fails a trial of conservative measures with compression therapy nonsteroidals and leg elevation.  Fabienne Bruns, MD [facility] [place] Office: [phone] Pager: [phone]

## 2013-03-04 ENCOUNTER — Other Ambulatory Visit: Payer: Self-pay

## 2013-03-04 DIAGNOSIS — I83893 Varicose veins of bilateral lower extremities with other complications: Secondary | ICD-10-CM

## 2013-03-07 ENCOUNTER — Encounter: Payer: Self-pay | Admitting: Obstetrics & Gynecology

## 2013-03-07 ENCOUNTER — Ambulatory Visit (INDEPENDENT_AMBULATORY_CARE_PROVIDER_SITE_OTHER): Payer: BC Managed Care – PPO | Admitting: Obstetrics & Gynecology

## 2013-03-07 VITALS — BP 106/79 | HR 55 | Resp 16 | Ht 65.0 in | Wt 163.0 lb

## 2013-03-07 DIAGNOSIS — Z30431 Encounter for routine checking of intrauterine contraceptive device: Secondary | ICD-10-CM

## 2013-03-19 ENCOUNTER — Encounter: Payer: Self-pay | Admitting: Obstetrics & Gynecology

## 2013-03-19 NOTE — Progress Notes (Signed)
  Subjective:    Patient ID: Tara Rocha, female    DOB: 26-Dec-1980, 32 y.o.   MRN: [id number]  HPI  She is here for IUD check. She has no complaints.  Review of Systems     Objective:   Physical Exam  IUD string seen     Assessment & Plan:  IUD string check- doing well

## 2013-05-24 ENCOUNTER — Encounter: Payer: Self-pay | Admitting: Vascular Surgery

## 2013-05-28 ENCOUNTER — Ambulatory Visit (INDEPENDENT_AMBULATORY_CARE_PROVIDER_SITE_OTHER): Payer: BC Managed Care – PPO | Admitting: Vascular Surgery

## 2013-05-28 ENCOUNTER — Encounter: Payer: Self-pay | Admitting: Vascular Surgery

## 2013-05-28 VITALS — BP 122/70 | HR 68 | Resp 18 | Ht 65.0 in | Wt 157.8 lb

## 2013-05-28 DIAGNOSIS — I83893 Varicose veins of bilateral lower extremities with other complications: Secondary | ICD-10-CM | POA: Insufficient documentation

## 2013-05-28 NOTE — Progress Notes (Signed)
Problems with Activities of Daily Living Secondary to Leg Pain  1. 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  This is very difficult due to leg pain and swelling.  2. Mrs. Seifer states that she has had to decrease duration and frequency of exercise due to leg pain.  3. Mrs. Schachter states that she is mother of 2 young children and that leg pain and swelling make caring for them difficult and challenging.   Failure of  Conservative Therapy:  1. Worn 20-30 mm Hg thigh high compression hose >3 months with no relief of symptoms.  2. Frequently elevates legs-no relief of symptoms  3. Taken Ibuprofen 600 Mg TID with no relief of symptoms.

## 2013-05-28 NOTE — Progress Notes (Signed)
The patient presents or continue discussion of bilateral severe venous hypertension. She has been very compliant with her compression since initial visit in our office in June. She continues to have severe pain throughout both legs and thighs and extending into her calf. This is much worse with prolonged standing.  Past Medical History  Diagnosis Date  . Poor circulation   . History of staph infection 08-2011    Required surgical excision (Left thigh)  . Varicose veins   . Leg pain     History  Substance Use Topics  . Smoking status: Never Smoker   . Smokeless tobacco: Never Used  . Alcohol Use: No    Family History  Problem Relation Age of Onset  . Anemia Mother   . Other Mother     varicose veins  . Cancer Paternal Grandmother     liver  . Thyroid disease Maternal Grandmother   . Other Sister     varicose veins    Allergies  Allergen Reactions  . Bactrim [Sulfamethoxazole W-Trimethoprim] Nausea And Vomiting    Current outpatient prescriptions:Biotin 1000 MCG tablet, Take 1,000 mcg by mouth daily., Disp: , Rfl: ;  cholecalciferol (VITAMIN D) 1000 UNITS tablet, Take 2,000 Units by mouth daily., Disp: , Rfl: ;  ferrous sulfate 325 (65 FE) MG tablet, Take 325 mg by mouth daily with breakfast., Disp: , Rfl: ;  IUD's (PARAGARD INTRAUTERINE COPPER) IUD IUD, 1 each by Intrauterine route once., Disp: , Rfl:  lidocaine (XYLOCAINE) 5 % ointment, Apply topically as needed., Disp: 35.44 g, Rfl: 1;  Multiple Vitamin (MULTIVITAMIN) tablet, Take 1 tablet by mouth daily., Disp: , Rfl: ;  Zinc 15 MG CAPS, Take by mouth daily., Disp: , Rfl:   BP 122/70  Pulse 68  Resp 18  Ht 5\' 5"  (1.651 m)  Wt 157 lb 12.8 oz (71.578 kg)  BMI 26.26 kg/m2  Body mass index is 26.26 kg/(m^2).       On physical exam she does have marked tributary varicosities extending throughout her entire left anterior thigh and onto her calf. Varicosities on the right leg in her thigh and posterior calf are not as  extensive as on the left leg.  I did reimage her legs with SonoSite ultrasound and compared this to her formal venous duplex in June. This does show marked hypertension and reflux in her great saphenous vein and also anterior saphenous vein bilaterally. Large plexus varicosities arises near the saphenofemoral junction the left lateral thigh.  Impression and plan: Failed conservative l treatment of severe bilateral venous hypertension. Have recommended a staged laser ablation of her great saphenous vein and stab phlebectomy of multiple tributary varicosities. I explained the procedures outpatient under local anesthesia. The patient wished to proceed as soon as possible. The left leg is no significant this will be treated first

## 2013-05-30 ENCOUNTER — Ambulatory Visit (INDEPENDENT_AMBULATORY_CARE_PROVIDER_SITE_OTHER): Payer: BC Managed Care – PPO | Admitting: Obstetrics & Gynecology

## 2013-05-30 ENCOUNTER — Encounter: Payer: Self-pay | Admitting: Obstetrics & Gynecology

## 2013-05-30 VITALS — BP 117/63 | HR 66 | Resp 16 | Ht 65.0 in | Wt 151.0 lb

## 2013-05-30 DIAGNOSIS — Z01419 Encounter for gynecological examination (general) (routine) without abnormal findings: Secondary | ICD-10-CM

## 2013-05-30 MED ORDER — TRIAMCINOLONE ACETONIDE 0.025 % EX CREA: TOPICAL_CREAM | Freq: Two times a day (BID) | CUTANEOUS | Status: DC

## 2013-05-30 NOTE — Progress Notes (Signed)
  Subjective:     Tara Rocha is a 32 y.o. female here for a routine exam.  Current complaints: redness of left breast than worsens when she gets hot.  Doing well with IUD (periods are better after removal and reinsertion).  Personal health questionnaire reviewed: yes.   Gynecologic History Patient's last menstrual period was 05/09/2013. Contraception: IUD Last Pap: 2013. Results were: normal Last mammogram: n/a.  Obstetric History OB History  Gravida Para Term Preterm AB SAB TAB Ectopic Multiple Living  3 2 2  1 1    2     # Outcome Date GA Lbr Len/2nd Weight Sex Delivery Anes PTL Lv  3 TRM 03/18/11 [redacted]w[redacted]d  7 lb 4 oz (3.289 kg) F CCS EPI  Y     Comments: poly hydramnios  2 SAB 02/24/10 [redacted]w[redacted]d         1 TRM 04/27/07 [redacted]w[redacted]d 24:00 9 lb (4.082 kg) F CCS EPI  Y       The following portions of the patient's history were reviewed and updated as appropriate: allergies, current medications, past family history, past medical history, past social history, past surgical history and problem list.  Review of Systems Pertinent items are noted in HPI.    Objective:   Filed Vitals:   05/30/13 1500  BP: 117/63  Pulse: 66  Resp: 16  Height: 5\' 5"  (1.651 m)  Weight: 151 lb (68.493 kg)      Vitals:  WNL General appearance: alert, cooperative and no distress Head: Normocephalic, without obvious abnormality, atraumatic Eyes: negative Throat: lips, mucosa, and tongue normal; teeth and gums normal Lungs: clear to auscultation bilaterally Breasts: normal appearance, no masses or tenderness, No nipple retraction or dimpling, No nipple discharge or bleeding.  Left breast has redness than seems to allign with stria superiorly, but is also inferiorly under most of nipple.  No skin thickening.  ?dermatitis from working out (pt has lost 121 pounds) Heart: regular rate and rhythm Abdomen: soft, non-tender; bowel sounds normal; no masses,  no organomegaly Pelvic: cervix normal in appearance, external  genitalia normal, no adnexal masses or tenderness, no bladder tenderness, no cervical motion tenderness, perianal skin: no external genital warts noted, l, urethra without abnormality or discharge, uterus normal size, shape, and consistency and vagina normal without discharge Extremities: mass in left anterior thigh noted.  Vascular surgeon says this is cluster of veins.  She is having a laser ablation soon. Skin: no lesions or rash Lymph nodes: Axillary adenopathy: none        Assessment:    Healthy female exam.  Left breast skin irritation   Plan:    Education reviewed: self breast exams. Contraception: IUD. Follow up in: 2 weeks. if breast irritation is not resolved iwth triamcenalone cream. Pap next year (nml pap last year and no history of abnml paps).

## 2013-06-04 ENCOUNTER — Ambulatory Visit: Payer: BC Managed Care – PPO | Admitting: Vascular Surgery

## 2013-08-21 ENCOUNTER — Other Ambulatory Visit: Payer: Self-pay | Admitting: *Deleted

## 2013-08-21 DIAGNOSIS — I83893 Varicose veins of bilateral lower extremities with other complications: Secondary | ICD-10-CM

## 2013-10-02 ENCOUNTER — Encounter: Payer: Self-pay | Admitting: Vascular Surgery

## 2013-10-03 ENCOUNTER — Encounter: Payer: Self-pay | Admitting: Vascular Surgery

## 2013-10-03 ENCOUNTER — Ambulatory Visit (INDEPENDENT_AMBULATORY_CARE_PROVIDER_SITE_OTHER): Payer: BC Managed Care – PPO | Admitting: Vascular Surgery

## 2013-10-03 ENCOUNTER — Telehealth: Payer: Self-pay | Admitting: *Deleted

## 2013-10-03 VITALS — BP 114/75 | HR 71 | Resp 18 | Ht 65.0 in | Wt 150.0 lb

## 2013-10-03 DIAGNOSIS — I83893 Varicose veins of bilateral lower extremities with other complications: Secondary | ICD-10-CM

## 2013-10-03 HISTORY — PX: ENDOVENOUS ABLATION SAPHENOUS VEIN W/ LASER: SUR449

## 2013-10-03 NOTE — Telephone Encounter (Signed)
Tara Rocha had endovenous laser ablation left greater saphenous vein and stab phlebectomy >20 Incisions left leg by Gretta Beganodd Early MD this morning (-1--04-2014).  She states that she is experiencing pain in left calf (burning, stinging sensation) with no swelling noted.  She has taken Ibuprofen 600 mg with lunch and is using ice packs on left leg and is elevating left leg while sitting.  Recommended that she increase Ibuprofen to 800 mg with meals (three times daily) and keep ice packs on affected area of left leg.  [name] verbalized understanding and will call [facility] if leg pain worsens or if she experiences swelling in left foot and ankle.

## 2013-10-03 NOTE — Progress Notes (Signed)
   Laser Ablation Procedure      Date: 10/03/2013    Tara Rocha DOB:06/12/1981  Consent signed: Yes  Surgeon:T.F. Tamar Lipscomb  Procedure: Laser Ablation: left Greater Saphenous Vein  BP 114/75  Pulse 71  Resp 18  Ht 5\' 5"  (1.651 m)  Wt 150 lb (68.04 kg)  BMI 24.96 kg/m2  Start time: 8:40AM   End time: 10:35AM  Tumescent Anesthesia: 800 cc 0.9% NaCl with 50 cc Lidocaine HCL with 1% Epi and 15 cc 8.4% NaHCO3  Local Anesthesia: 6 cc Lidocaine HCL and NaHCO3 (ratio 2:1)  Continuous Mode: 15 Watts Total Energy 1503 Joules Total Time1:41     Stab Phlebectomy: >20 INCISIONS LEFT LEG Sites: Thigh and Calf  Patient tolerated procedure well: Yes    Description of Procedure:  After marking the course of the saphenous vein and the secondary varicosities in the standing position, the patient was placed on the operating table in the supine position, and the left leg was prepped and draped in sterile fashion. Local anesthetic was administered, and under ultrasound guidance the saphenous vein was accessed with a micro needle and guide wire; then the micro puncture sheath was placed. A guide wire was inserted to the saphenofemoral junction, followed by a 5 french sheath.  The position of the sheath and then the laser fiber below the junction was confirmed using the ultrasound and visualization of the aiming beam.  Tumescent anesthesia was administered along the course of the saphenous vein using ultrasound guidance. Protective laser glasses were placed on the patient, and the laser was fired at at 15 watt continuous mode.  For a total of 1503 joules.  A steri strip was applied to the puncture site.  The patient was then put into Trendelenburg position.  Local anesthetic was utilized overlying the marked varicosities.  Greater than 20 stab wounds were made using the tip of an 11 blade; and using the vein hook,  The phlebectomies were performed using a hemostat to avulse these varicosities.  Adequate  hemostasis was achieved, and steri strips were applied to the stab wound.      ABD pads and thigh high compression stockings were applied.  Ace wrap bandages were applied over the phlebectomy sites and at the top of the saphenofemoral junction.  Blood loss was less than 15 cc.  The patient ambulated out of the operating room having tolerated the procedure well.

## 2013-10-04 ENCOUNTER — Telehealth: Payer: Self-pay | Admitting: *Deleted

## 2013-10-04 ENCOUNTER — Encounter: Payer: Self-pay | Admitting: Vascular Surgery

## 2013-10-04 NOTE — Telephone Encounter (Signed)
    10/04/2013  Time: 12:20 PM   Patient Name: Tara Rocha  Patient of: T.F. Early  Procedure:Laser Ablation left greater saphenous vein and stab phlebectomy > 20 incisions left leg 10-03-2013  Reached patient at home and checked  Her status  Yes    Comments/Actions Taken: Mrs. [name] states her left leg pain is "a little bit better" than yesterday.  No problems with left leg, ankle, or foot swelling. No problems with bleeding/oozing.  Mrs. [name] states she is taking Ibuprofen 800 mg three times daily with food and is keeping ice packs on her left leg.  Reviewed post procedural instructions with Mrs. [name] and reminded her of the post laser ablation duplex and follow up with Dr. Arbie CookeyEarly on 10-08-2013.  Mrs. [name] will call [facility] for further questions or concerns.     @SIGNATURE @

## 2013-10-07 ENCOUNTER — Encounter: Payer: Self-pay | Admitting: Vascular Surgery

## 2013-10-08 ENCOUNTER — Ambulatory Visit (HOSPITAL_COMMUNITY)
Admission: RE | Admit: 2013-10-08 | Discharge: 2013-10-08 | Disposition: A | Discharge status: Home / Self Care | Payer: BC Managed Care – PPO | Source: Ambulatory Visit | Attending: Vascular Surgery | Admitting: Vascular Surgery

## 2013-10-08 ENCOUNTER — Encounter: Payer: Self-pay | Admitting: Vascular Surgery

## 2013-10-08 ENCOUNTER — Ambulatory Visit (INDEPENDENT_AMBULATORY_CARE_PROVIDER_SITE_OTHER): Payer: Self-pay | Admitting: Vascular Surgery

## 2013-10-08 VITALS — BP 101/75 | HR 73 | Resp 18 | Ht 65.0 in | Wt 150.0 lb

## 2013-10-08 DIAGNOSIS — I83893 Varicose veins of bilateral lower extremities with other complications: Secondary | ICD-10-CM

## 2013-10-08 NOTE — Progress Notes (Signed)
Here today for followup of laser ablation of left great saphenous vein and stab phlebectomy of more than 20 curvature varicosities throughout her anterior thigh and calf. He reports significant pain associated with the phlebectomy sites after the procedure on Thursday and also on Friday. She's had marked improvement in discomfort since that time. Interestingly she had minimal discomfort over the medial thigh ablation site.  On physical exam she does have typical bruising at the phlebectomy and minimal erythema at the ablation.  Plexus a reveals a good result with ablation of her great saphenous vein and no evidence of DVT.  She will continue to wear her compression garment for additional week and will see us again in 2 weeks for similar treatment to her right leg. She has less phlebectomy is in the right in a for less discomfort with the procedure

## 2013-10-16 ENCOUNTER — Encounter: Payer: Self-pay | Admitting: Vascular Surgery

## 2013-10-17 ENCOUNTER — Ambulatory Visit (INDEPENDENT_AMBULATORY_CARE_PROVIDER_SITE_OTHER): Payer: BC Managed Care – PPO | Admitting: Vascular Surgery

## 2013-10-17 ENCOUNTER — Encounter: Payer: Self-pay | Admitting: Vascular Surgery

## 2013-10-17 VITALS — BP 130/64 | HR 72 | Resp 16 | Ht 65.0 in | Wt 150.0 lb

## 2013-10-17 DIAGNOSIS — I83893 Varicose veins of bilateral lower extremities with other complications: Secondary | ICD-10-CM

## 2013-10-17 HISTORY — PX: ENDOVENOUS ABLATION SAPHENOUS VEIN W/ LASER: SUR449

## 2013-10-17 NOTE — Progress Notes (Signed)
   Laser Ablation Procedure      Date: 10/17/2013    Tara DollyShawna L Rocha DOB:11-Jun-1981  Consent signed: Yes  Surgeon:T.F. Desiree Daise  Procedure: Laser Ablation: right Greater Saphenous Vein  BP 130/64  Pulse 72  Resp 16  Ht 5\' 5"  (1.651 m)  Wt 150 lb (68.04 kg)  BMI 24.96 kg/m2  Start time: 8:35 am   End time: 10:10am  Tumescent Anesthesia: 600 cc 0.9% NaCl with 50 cc Lidocaine HCL with 1% Epi and 15 cc 8.4% NaHCO3  Local Anesthesia: 6 cc Lidocaine HCL and NaHCO3 (ratio 2:1)  Continuous Mode: 15 Watts Total Energy 2077 Joules Total Time2:19      Stab Phlebectomy: 10-20 Sites: Thigh and Calf right leg  Patient tolerated procedure well: Yes    Description of Procedure:  After marking the course of the saphenous vein and the secondary varicosities in the standing position, the patient was placed on the operating table in the supine position, and the right leg was prepped and draped in sterile fashion. Local anesthetic was administered, and under ultrasound guidance the saphenous vein was accessed with a micro needle and guide wire; then the micro puncture sheath was placed. A guide wire was inserted to the saphenofemoral junction, followed by a 5 french sheath.  The position of the sheath and then the laser fiber below the junction was confirmed using the ultrasound and visualization of the aiming beam.  Tumescent anesthesia was administered along the course of the saphenous vein using ultrasound guidance. Protective laser glasses were placed on the patient, and the laser was fired at at 15 watt continuous mode.  For a total of 2077 joules.  A steri strip was applied to the puncture site.  The patient was then put into Trendelenburg position.  Local anesthetic was utilized overlying the marked varicosities.   10-20 stab wounds were made using the tip of an 11 blade; and using the vein hook,  The phlebectomies were performed using a hemostat to avulse these varicosities.  Adequate hemostasis  was achieved, and steri strips were applied to the stab wound.      ABD pads and thigh high compression stockings were applied.  Ace wrap bandages were applied over the phlebectomy sites and at the top of the saphenofemoral junction.  Blood loss was less than 15 cc.  The patient ambulated out of the operating room having tolerated the procedure well.

## 2013-10-18 ENCOUNTER — Telehealth: Payer: Self-pay | Admitting: *Deleted

## 2013-10-18 ENCOUNTER — Encounter: Payer: Self-pay | Admitting: Vascular Surgery

## 2013-10-18 NOTE — Telephone Encounter (Signed)
    10/18/2013  Time: 1:08 PM   Patient Name: Tara Rocha  Patient of: T.F. Early  Procedure:Laser Ablation right  Greater saphenous vein and stab phlebectomy 10-20 incisions right leg 10-17-2013  Reached patient at home and checked  Her status  Yes    Comments/Actions Taken: Mrs. [name] states that she has mild/moderate pain right inner thigh that is improved with Ibuprofen and ice packs.  No c/o right leg/foot swelling or bleeding/oozing.  Reviewed post procedural instructions with her and reminded her of post LA duplex and follow up appointment with Dr. Arbie CookeyEarly on 10-24-2013.      @SIGNATURE @

## 2013-10-23 ENCOUNTER — Encounter: Payer: Self-pay | Admitting: Vascular Surgery

## 2013-10-24 ENCOUNTER — Ambulatory Visit (INDEPENDENT_AMBULATORY_CARE_PROVIDER_SITE_OTHER): Payer: Self-pay | Admitting: Vascular Surgery

## 2013-10-24 ENCOUNTER — Ambulatory Visit (HOSPITAL_COMMUNITY)
Admission: RE | Admit: 2013-10-24 | Discharge: 2013-10-24 | Disposition: A | Discharge status: Home / Self Care | Payer: BC Managed Care – PPO | Source: Ambulatory Visit | Attending: Vascular Surgery | Admitting: Vascular Surgery

## 2013-10-24 ENCOUNTER — Encounter: Payer: Self-pay | Admitting: Vascular Surgery

## 2013-10-24 VITALS — BP 115/71 | HR 70 | Resp 16 | Ht 65.0 in | Wt 150.0 lb

## 2013-10-24 DIAGNOSIS — Z09 Encounter for follow-up examination after completed treatment for conditions other than malignant neoplasm: Secondary | ICD-10-CM | POA: Insufficient documentation

## 2013-10-24 DIAGNOSIS — I83893 Varicose veins of bilateral lower extremities with other complications: Secondary | ICD-10-CM

## 2013-10-24 DIAGNOSIS — M79609 Pain in unspecified limb: Secondary | ICD-10-CM | POA: Insufficient documentation

## 2013-10-24 NOTE — Progress Notes (Signed)
The patient has today for all of her right great saphenous vein ablation and stab phlebectomy of multiple tributary varicosities. She had similar treatment on her other leg several weeks before. She has had minimal discomfort associated with this.  On physical exam both legs are quite good today. She is mild bruising at the phlebectomy sites.  Duplex today reveals closure of her great saphenous vein from the insertion site to 1 cm below the saphenofemoral junction with no evidence of DVT.  Impression and plan: Successful ablation bilateral great saphenous veins and stab phlebectomy and staged procedures. Continue wearing her compression on her right leg for one additional week and then when necessary. She will see us again on an as-needed basis

## 2014-01-29 ENCOUNTER — Ambulatory Visit (INDEPENDENT_AMBULATORY_CARE_PROVIDER_SITE_OTHER): Payer: BC Managed Care – PPO | Admitting: Obstetrics & Gynecology

## 2014-01-29 ENCOUNTER — Encounter: Payer: Self-pay | Admitting: Obstetrics & Gynecology

## 2014-01-29 VITALS — BP 115/77 | HR 71 | Resp 16 | Ht 65.0 in | Wt 160.0 lb

## 2014-01-29 DIAGNOSIS — T148XXA Other injury of unspecified body region, initial encounter: Secondary | ICD-10-CM

## 2014-01-29 DIAGNOSIS — Z30432 Encounter for removal of intrauterine contraceptive device: Secondary | ICD-10-CM

## 2014-01-29 DIAGNOSIS — R5381 Other malaise: Secondary | ICD-10-CM

## 2014-01-29 DIAGNOSIS — R5383 Other fatigue: Secondary | ICD-10-CM

## 2014-01-29 DIAGNOSIS — L659 Nonscarring hair loss, unspecified: Secondary | ICD-10-CM

## 2014-01-29 LAB — CBC
HCT: 40.8 % (ref 36.0–46.0)
HEMOGLOBIN: 14 g/dL (ref 12.0–15.0)
MCH: 30 pg (ref 26.0–34.0)
MCHC: 34.3 g/dL (ref 30.0–36.0)
MCV: 87.6 fL (ref 78.0–100.0)
Platelets: 267 10*3/uL (ref 150–400)
RBC: 4.66 MIL/uL (ref 3.87–5.11)
RDW: 12.7 % (ref 11.5–15.5)
WBC: 5.7 10*3/uL (ref 4.0–10.5)

## 2014-01-29 NOTE — Progress Notes (Signed)
   Subjective:    Patient ID: Tara Rocha, female    DOB: [birth year], 33 y.o.   MRN: [id number]  HPI  33 yo MW lady who is here today because she would like her Paragard removed. She has been having hair loss and easy bruising and thinks that this may be due to the copper in the paragard. She plans to use condoms for contraception until she and her husband make a decision about possible permanent sterility.  Review of Systems     Objective:   Physical Exam  Paragard easily removed and noted to be intact      Assessment & Plan:  As above Check TSH and CBC

## 2014-01-30 LAB — TSH: TSH: 1.432 u[IU]/mL (ref 0.350–4.500)

## 2014-02-05 ENCOUNTER — Ambulatory Visit: Payer: BC Managed Care – PPO | Admitting: *Deleted

## 2014-06-11 ENCOUNTER — Ambulatory Visit: Payer: BC Managed Care – PPO | Admitting: *Deleted

## 2014-06-18 ENCOUNTER — Other Ambulatory Visit: Payer: Self-pay | Admitting: Obstetrics & Gynecology

## 2014-06-18 ENCOUNTER — Encounter: Payer: Self-pay | Admitting: Obstetrics & Gynecology

## 2014-06-18 ENCOUNTER — Ambulatory Visit (INDEPENDENT_AMBULATORY_CARE_PROVIDER_SITE_OTHER): Payer: BC Managed Care – PPO | Admitting: Obstetrics & Gynecology

## 2014-06-18 VITALS — BP 124/54 | HR 58 | Wt 172.0 lb

## 2014-06-18 DIAGNOSIS — Z3491 Encounter for supervision of normal pregnancy, unspecified, first trimester: Secondary | ICD-10-CM

## 2014-06-18 DIAGNOSIS — Z3480 Encounter for supervision of other normal pregnancy, unspecified trimester: Secondary | ICD-10-CM | POA: Diagnosis not present

## 2014-06-18 DIAGNOSIS — Z3490 Encounter for supervision of normal pregnancy, unspecified, unspecified trimester: Secondary | ICD-10-CM | POA: Insufficient documentation

## 2014-06-18 NOTE — Progress Notes (Signed)
Increase in fatigue.  Last pap 2013   Bedside U/S showed IUP with FHT of 173 BPM and CRL 17.14mm  GA [redacted]w[redacted]d

## 2014-06-18 NOTE — Addendum Note (Signed)
Addended by: Granville Lewis on: 06/18/2014 10:38 AM   Modules accepted: Orders

## 2014-06-18 NOTE — Progress Notes (Signed)
Subjective:    Tara Rocha is a [id number] [redacted]w[redacted]d being seen today for her first obstetrical visit.  Her obstetrical history is significant for c/s x2.  . Patient does intend to breast feed. Pregnancy history fully reviewed.  Patient reports fatigue and nausea.  Filed Vitals:   06/18/14 0931  BP: 124/54  Pulse: 58  Weight: 172 lb (78.019 kg)    HISTORY: OB History  Gravida Para Term Preterm AB SAB TAB Ectopic Multiple Living  # Outcome Date GA Lbr Len/2nd Weight Sex Delivery Anes PTL Lv  4 CUR           3 TRM 03/18/11 [redacted]w[redacted]d  7 lb 4 oz (3.289 kg) F CCS EPI  Y     Comments: poly hydramnios  2 SAB 02/24/10 [redacted]w[redacted]d         1 TRM 04/27/07 [redacted]w[redacted]d 24:00 9 lb (4.082 kg) F CCS EPI  Y     Past Medical History  Diagnosis Date  . Poor circulation   . History of staph infection 08-2011    Required surgical excision (Left thigh)  . Varicose veins   . Leg pain    Past Surgical History  Procedure Laterality Date  . Tonsillectomy      age 56  . Cesarean section  2008, 2012  . Leg skin lesion  biopsy / excision  08-2011    Left upper thigh skin excision for Staph Infection  . Endovenous ablation saphenous vein w/ laser Left 10-03-2013    left greater saphenous vein and stab phlebectomies > 20 incisions left leg by [name] MD   . Endovenous ablation saphenous vein w/ laser Right 10-17-2013    endovenous laser ablation with stab phlebectomy 10-20 incisions right leg   Family History  Problem Relation Age of Onset  . Anemia Mother   . Other Mother     varicose veins  . Cancer Paternal Grandmother     liver  . Thyroid disease Maternal Grandmother   . Other Sister     varicose veins     Exam    Uterus:     Pelvic Exam:    Perineum: No Hemorrhoids   Vulva: normal   Vagina:  normal mucosa   pH: n/a   Cervix: no lesions   Adnexa: normal adnexa   Bony Pelvis: platypelloid  System: Breast:  normal appearance, no masses or tenderness   Skin: normal  coloration and turgor, no rashes    Neurologic: oriented, normal mood   Extremities: no deformities, no erythema, induration, or nodules   HEENT sclera clear, anicteric, oropharynx clear, no lesions, neck supple with midline trachea and thyroid without masses   Mouth/Teeth mucous membranes moist, pharynx normal without lesions   Neck supple and no masses   Cardiovascular: regular rate and rhythm   Respiratory:  appears well, vitals normal, no respiratory distress, acyanotic, normal RR, neck free of mass or lymphadenopathy, chest clear, no wheezing, crepitations, rhonchi, normal symmetric air entry   Abdomen: soft, non-tender; bowel sounds normal; no masses,  no organomegaly   Urinary: urethral meatus normal      Assessment:    Pregnancy: [id number] Patient Active Problem List   Diagnosis Date Noted  . Varicose veins of lower extremities with other complications 05/28/2013  . Pelvic pain in female 11/27/2012  . Lymphedema 04/30/2011        Plan:     Initial labs  drawn. Prenatal vitamins. Problem list reviewed and updated. Genetic Screening discussed First Screen: declined.  Ultrasound discussed; fetal survey: ordered.  Wants to be done in [facility]  Follow up in 4 weeks. B6 for nausea (delcines other meds) Rpt c/s at 39 weeks. Bedside US done today.  Nekeya Briski H. 06/18/2014

## 2014-06-18 NOTE — Patient Instructions (Signed)
First Trimester of Pregnancy The first trimester of pregnancy is from week 1 until the end of week 12 (months 1 through 3). A week after a sperm fertilizes an egg, the egg will implant on the wall of the uterus. This embryo will begin to develop into a baby. Genes from you and your partner are forming the baby. The female genes determine whether the baby is a boy or a girl. At 6-8 weeks, the eyes and face are formed, and the heartbeat can be seen on ultrasound. At the end of 12 weeks, all the baby's organs are formed.  Now that you are pregnant, you will want to do everything you can to have a healthy baby. Two of the most important things are to get good prenatal care and to follow your health care provider's instructions. Prenatal care is all the medical care you receive before the baby's birth. This care will help prevent, find, and treat any problems during the pregnancy and childbirth. BODY CHANGES Your body goes through many changes during pregnancy. The changes vary from woman to woman.   You may gain or lose a couple of pounds at first.  You may feel sick to your stomach (nauseous) and throw up (vomit). If the vomiting is uncontrollable, call your health care provider.  You may tire easily.  You may develop headaches that can be relieved by medicines approved by your health care provider.  You may urinate more often. Painful urination may mean you have a bladder infection.  You may develop heartburn as a result of your pregnancy.  You may develop constipation because certain hormones are causing the muscles that push waste through your intestines to slow down.  You may develop hemorrhoids or swollen, bulging veins (varicose veins).  Your breasts may begin to grow larger and become tender. Your nipples may stick out more, and the tissue that surrounds them (areola) may become darker.  Your gums may bleed and may be sensitive to brushing and flossing.  Dark spots or blotches (chloasma,  mask of pregnancy) may develop on your face. This will likely fade after the baby is born.  Your menstrual periods will stop.  You may have a loss of appetite.  You may develop cravings for certain kinds of food.  You may have changes in your emotions from day to day, such as being excited to be pregnant or being concerned that something may go wrong with the pregnancy and baby.  You may have more vivid and strange dreams.  You may have changes in your hair. These can include thickening of your hair, rapid growth, and changes in texture. Some women also have hair loss during or after pregnancy, or hair that feels dry or thin. Your hair will most likely return to normal after your baby is born. WHAT TO EXPECT AT YOUR PRENATAL VISITS During a routine prenatal visit:  You will be weighed to make sure you and the baby are growing normally.  Your blood pressure will be taken.  Your abdomen will be measured to track your baby's growth.  The fetal heartbeat will be listened to starting around week 10 or 12 of your pregnancy.  Test results from any previous visits will be discussed. Your health care provider may ask you:  How you are feeling.  If you are feeling the baby move.  If you have had any abnormal symptoms, such as leaking fluid, bleeding, severe headaches, or abdominal cramping.  If you have any questions. Other tests   that may be performed during your first trimester include:  Blood tests to find your blood type and to check for the presence of any previous infections. They will also be used to check for low iron levels (anemia) and Rh antibodies. Later in the pregnancy, blood tests for diabetes will be done along with other tests if problems develop.  Urine tests to check for infections, diabetes, or protein in the urine.  An ultrasound to confirm the proper growth and development of the baby.  An amniocentesis to check for possible genetic problems.  Fetal screens for  spina bifida and Down syndrome.  You may need other tests to make sure you and the baby are doing well. HOME CARE INSTRUCTIONS  Medicines  Follow your health care provider's instructions regarding medicine use. Specific medicines may be either safe or unsafe to take during pregnancy.  Take your prenatal vitamins as directed.  If you develop constipation, try taking a stool softener if your health care provider approves. Diet  Eat regular, well-balanced meals. Choose a variety of foods, such as meat or vegetable-based protein, fish, milk and low-fat dairy products, vegetables, fruits, and whole grain breads and cereals. Your health care provider will help you determine the amount of weight gain that is right for you.  Avoid raw meat and uncooked cheese. These carry germs that can cause birth defects in the baby.  Eating four or five small meals rather than three large meals a day may help relieve nausea and vomiting. If you start to feel nauseous, eating a few soda crackers can be helpful. Drinking liquids between meals instead of during meals also seems to help nausea and vomiting.  If you develop constipation, eat more high-fiber foods, such as fresh vegetables or fruit and whole grains. Drink enough fluids to keep your urine clear or pale yellow. Activity and Exercise  Exercise only as directed by your health care provider. Exercising will help you:  Control your weight.  Stay in shape.  Be prepared for labor and delivery.  Experiencing pain or cramping in the lower abdomen or low back is a good sign that you should stop exercising. Check with your health care provider before continuing normal exercises.  Try to avoid standing for long periods of time. Move your legs often if you must stand in one place for a long time.  Avoid heavy lifting.  Wear low-heeled shoes, and practice good posture.  You may continue to have sex unless your health care provider directs you  otherwise. Relief of Pain or Discomfort  Wear a good support bra for breast tenderness.   Take warm sitz baths to soothe any pain or discomfort caused by hemorrhoids. Use hemorrhoid cream if your health care provider approves.   Rest with your legs elevated if you have leg cramps or low back pain.  If you develop varicose veins in your legs, wear support hose. Elevate your feet for 15 minutes, 3-4 times a day. Limit salt in your diet. Prenatal Care  Schedule your prenatal visits by the twelfth week of pregnancy. They are usually scheduled monthly at first, then more often in the last 2 months before delivery.  Write down your questions. Take them to your prenatal visits.  Keep all your prenatal visits as directed by your health care provider. Safety  Wear your seat belt at all times when driving.  Make a list of emergency phone numbers, including numbers for family, friends, the hospital, and police and fire departments. General Tips    Ask your health care provider for a referral to a local prenatal education class. Begin classes no later than at the beginning of month 6 of your pregnancy.  Ask for help if you have counseling or nutritional needs during pregnancy. Your health care provider can offer advice or refer you to specialists for help with various needs.  Do not use hot tubs, steam rooms, or saunas.  Do not douche or use tampons or scented sanitary pads.  Do not cross your legs for long periods of time.  Avoid cat litter boxes and soil used by cats. These carry germs that can cause birth defects in the baby and possibly loss of the fetus by miscarriage or stillbirth.  Avoid all smoking, herbs, alcohol, and medicines not prescribed by your health care provider. Chemicals in these affect the formation and growth of the baby.  Schedule a dentist appointment. At home, brush your teeth with a soft toothbrush and be gentle when you floss. SEEK MEDICAL CARE IF:   You have  dizziness.  You have mild pelvic cramps, pelvic pressure, or nagging pain in the abdominal area.  You have persistent nausea, vomiting, or diarrhea.  You have a bad smelling vaginal discharge.  You have pain with urination.  You notice increased swelling in your face, hands, legs, or ankles. SEEK IMMEDIATE MEDICAL CARE IF:   You have a fever.  You are leaking fluid from your vagina.  You have spotting or bleeding from your vagina.  You have severe abdominal cramping or pain.  You have rapid weight gain or loss.  You vomit blood or material that looks like coffee grounds.  You are exposed to German measles and have never had them.  You are exposed to fifth disease or chickenpox.  You develop a severe headache.  You have shortness of breath.  You have any kind of trauma, such as from a fall or a car accident. Document Released: 09/06/2001 Document Revised: 01/27/2014 Document Reviewed: 07/23/2013 [employer] Patient Information 2015 [employer]. This information is not intended to replace advice given to you by your health care provider. Make sure you discuss any questions you have with your health care provider.  

## 2014-06-19 LAB — OBSTETRIC PANEL
ANTIBODY SCREEN: NEGATIVE
BASOS ABS: 0 10*3/uL (ref 0.0–0.1)
Basophils Relative: 0 % (ref 0–1)
Eosinophils Absolute: 0.1 10*3/uL (ref 0.0–0.7)
Eosinophils Relative: 1 % (ref 0–5)
HEMATOCRIT: 40.5 % (ref 36.0–46.0)
HEMOGLOBIN: 13.8 g/dL (ref 12.0–15.0)
Hepatitis B Surface Ag: NEGATIVE
Lymphocytes Relative: 21 % (ref 12–46)
Lymphs Abs: 1.4 10*3/uL (ref 0.7–4.0)
MCH: 30.2 pg (ref 26.0–34.0)
MCHC: 34.1 g/dL (ref 30.0–36.0)
MCV: 88.6 fL (ref 78.0–100.0)
MONO ABS: 0.4 10*3/uL (ref 0.1–1.0)
MONOS PCT: 6 % (ref 3–12)
NEUTROS ABS: 5 10*3/uL (ref 1.7–7.7)
Neutrophils Relative %: 72 % (ref 43–77)
Platelets: 242 10*3/uL (ref 150–400)
RBC: 4.57 MIL/uL (ref 3.87–5.11)
RDW: 13 % (ref 11.5–15.5)
RUBELLA: 3.66 {index} — AB (ref <0.90)
Rh Type: POSITIVE
WBC: 6.9 10*3/uL (ref 4.0–10.5)

## 2014-06-19 LAB — GC/CHLAMYDIA PROBE AMP
CT PROBE, AMP APTIMA: NEGATIVE
GC Probe RNA: NEGATIVE

## 2014-06-19 LAB — HIV ANTIBODY (ROUTINE TESTING W REFLEX): HIV 1&2 Ab, 4th Generation: NONREACTIVE

## 2014-06-20 LAB — CULTURE, URINE COMPREHENSIVE
Colony Count: NO GROWTH
Organism ID, Bacteria: NO GROWTH

## 2014-07-16 ENCOUNTER — Encounter: Payer: Self-pay | Admitting: Obstetrics & Gynecology

## 2014-07-16 ENCOUNTER — Ambulatory Visit (INDEPENDENT_AMBULATORY_CARE_PROVIDER_SITE_OTHER): Payer: BC Managed Care – PPO | Admitting: Obstetrics & Gynecology

## 2014-07-16 VITALS — BP 122/62 | HR 67 | Wt 174.0 lb

## 2014-07-16 DIAGNOSIS — Z3490 Encounter for supervision of normal pregnancy, unspecified, unspecified trimester: Secondary | ICD-10-CM

## 2014-07-16 NOTE — Progress Notes (Signed)
Routine visit. [facility] She will get an early glucola at next visit. We discussed recommended weight gain. She gained 80# with first pregnancy and 25 # with second one. She gained 40# with nursing each child. She declines flu vaccine.

## 2014-07-23 ENCOUNTER — Encounter: Payer: Self-pay | Admitting: *Deleted

## 2014-07-28 ENCOUNTER — Encounter: Payer: Self-pay | Admitting: Obstetrics & Gynecology

## 2014-08-25 ENCOUNTER — Encounter: Payer: BC Managed Care – PPO | Admitting: Obstetrics & Gynecology

## 2014-09-01 ENCOUNTER — Ambulatory Visit (INDEPENDENT_AMBULATORY_CARE_PROVIDER_SITE_OTHER): Payer: BC Managed Care – PPO | Admitting: Advanced Practice Midwife

## 2014-09-01 VITALS — BP 114/71 | HR 77 | Wt 182.0 lb

## 2014-09-01 DIAGNOSIS — Z36 Encounter for antenatal screening of mother: Secondary | ICD-10-CM | POA: Diagnosis not present

## 2014-09-01 DIAGNOSIS — Z3482 Encounter for supervision of other normal pregnancy, second trimester: Secondary | ICD-10-CM | POA: Diagnosis not present

## 2014-09-01 DIAGNOSIS — Z3481 Encounter for supervision of other normal pregnancy, first trimester: Secondary | ICD-10-CM

## 2014-09-01 DIAGNOSIS — Z3492 Encounter for supervision of normal pregnancy, unspecified, second trimester: Secondary | ICD-10-CM

## 2014-09-01 NOTE — Progress Notes (Signed)
Had a small amount of spotting after intercourse.  She is having increased headaches and nothing she has tried is helping.  She has tried Tylenol and it gives no relief.  She has also tried some essential oils which sometimes help.

## 2014-09-01 NOTE — Patient Instructions (Signed)
Second Trimester of Pregnancy The second trimester is from week 13 through week 28, months 4 through 6. The second trimester is often a time when you feel your best. Your body has also adjusted to being pregnant, and you begin to feel better physically. Usually, morning sickness has lessened or quit completely, you may have more energy, and you may have an increase in appetite. The second trimester is also a time when the fetus is growing rapidly. At the end of the sixth month, the fetus is about 9 inches long and weighs about 1 pounds. You will likely begin to feel the baby move (quickening) between 18 and 20 weeks of the pregnancy. BODY CHANGES Your body goes through many changes during pregnancy. The changes vary from woman to woman.   Your weight will continue to increase. You will notice your lower abdomen bulging out.  You may begin to get stretch marks on your hips, abdomen, and breasts.  You may develop headaches that can be relieved by medicines approved by your health care provider.  You may urinate more often because the fetus is pressing on your bladder.  You may develop or continue to have heartburn as a result of your pregnancy.  You may develop constipation because certain hormones are causing the muscles that push waste through your intestines to slow down.  You may develop hemorrhoids or swollen, bulging veins (varicose veins).  You may have back pain because of the weight gain and pregnancy hormones relaxing your joints between the bones in your pelvis and as a result of a shift in weight and the muscles that support your balance.  Your breasts will continue to grow and be tender.  Your gums may bleed and may be sensitive to brushing and flossing.  Dark spots or blotches (chloasma, mask of pregnancy) may develop on your face. This will likely fade after the baby is born.  A dark line from your belly button to the pubic area (linea nigra) may appear. This will likely fade  after the baby is born.  You may have changes in your hair. These can include thickening of your hair, rapid growth, and changes in texture. Some women also have hair loss during or after pregnancy, or hair that feels dry or thin. Your hair will most likely return to normal after your baby is born. WHAT TO EXPECT AT YOUR PRENATAL VISITS During a routine prenatal visit:  You will be weighed to make sure you and the fetus are growing normally.  Your blood pressure will be taken.  Your abdomen will be measured to track your baby's growth.  The fetal heartbeat will be listened to.  Any test results from the previous visit will be discussed. Your health care provider may ask you:  How you are feeling.  If you are feeling the baby move.  If you have had any abnormal symptoms, such as leaking fluid, bleeding, severe headaches, or abdominal cramping.  If you have any questions. Other tests that may be performed during your second trimester include:  Blood tests that check for:  Low iron levels (anemia).  Gestational diabetes (between 24 and 28 weeks).  Rh antibodies.  Urine tests to check for infections, diabetes, or protein in the urine.  An ultrasound to confirm the proper growth and development of the baby.  An amniocentesis to check for possible genetic problems.  Fetal screens for spina bifida and Down syndrome. HOME CARE INSTRUCTIONS   Avoid all smoking, herbs, alcohol, and unprescribed   drugs. These chemicals affect the formation and growth of the baby.  Follow your health care provider's instructions regarding medicine use. There are medicines that are either safe or unsafe to take during pregnancy.  Exercise only as directed by your health care provider. Experiencing uterine cramps is a good sign to stop exercising.  Continue to eat regular, healthy meals.  Wear a good support bra for breast tenderness.  Do not use hot tubs, steam rooms, or saunas.  Wear your  seat belt at all times when driving.  Avoid raw meat, uncooked cheese, cat litter boxes, and soil used by cats. These carry germs that can cause birth defects in the baby.  Take your prenatal vitamins.  Try taking a stool softener (if your health care provider approves) if you develop constipation. Eat more high-fiber foods, such as fresh vegetables or fruit and whole grains. Drink plenty of fluids to keep your urine clear or pale yellow.  Take warm sitz baths to soothe any pain or discomfort caused by hemorrhoids. Use hemorrhoid cream if your health care provider approves.  If you develop varicose veins, wear support hose. Elevate your feet for 15 minutes, 3-4 times a day. Limit salt in your diet.  Avoid heavy lifting, wear low heel shoes, and practice good posture.  Rest with your legs elevated if you have leg cramps or low back pain.  Visit your dentist if you have not gone yet during your pregnancy. Use a soft toothbrush to brush your teeth and be gentle when you floss.  A sexual relationship may be continued unless your health care provider directs you otherwise.  Continue to go to all your prenatal visits as directed by your health care provider. SEEK MEDICAL CARE IF:   You have dizziness.  You have mild pelvic cramps, pelvic pressure, or nagging pain in the abdominal area.  You have persistent nausea, vomiting, or diarrhea.  You have a bad smelling vaginal discharge.  You have pain with urination. SEEK IMMEDIATE MEDICAL CARE IF:   You have a fever.  You are leaking fluid from your vagina.  You have spotting or bleeding from your vagina.  You have severe abdominal cramping or pain.  You have rapid weight gain or loss.  You have shortness of breath with chest pain.  You notice sudden or extreme swelling of your face, hands, ankles, feet, or legs.  You have not felt your baby move in over an hour.  You have severe headaches that do not go away with  medicine.  You have vision changes. Document Released: 09/06/2001 Document Revised: 09/17/2013 Document Reviewed: 11/13/2012 ExitCare Patient Information 2015 [employer]. This information is not intended to replace advice given to you by your health care provider. Make sure you discuss any questions you have with your health care provider.  

## 2014-09-01 NOTE — Progress Notes (Signed)
Early glucola today. Feeling fetal movement. Has US scheduled for tomorrow.

## 2014-09-02 ENCOUNTER — Telehealth: Payer: Self-pay | Admitting: *Deleted

## 2014-09-02 LAB — GLUCOSE TOLERANCE, 1 HOUR (50G) W/O FASTING: Glucose, 1 Hour GTT: 77 mg/dL (ref 70–140)

## 2014-09-02 NOTE — Telephone Encounter (Signed)
Pt notified of normal early 1 hour GTT

## 2014-09-04 ENCOUNTER — Encounter: Payer: Self-pay | Admitting: *Deleted

## 2014-09-24 ENCOUNTER — Telehealth: Payer: Self-pay | Admitting: *Deleted

## 2014-09-24 NOTE — Telephone Encounter (Signed)
Pt called in c/o sciatica pain and hip bursitis. Pt asked if she could take ibuprofen. Explained to pt ibuprofen is ok until 3rd trimester. Pt expressed understanding.

## 2014-10-01 ENCOUNTER — Encounter: Payer: BC Managed Care – PPO | Admitting: Obstetrics & Gynecology

## 2014-10-08 ENCOUNTER — Ambulatory Visit (INDEPENDENT_AMBULATORY_CARE_PROVIDER_SITE_OTHER): Payer: Self-pay | Admitting: Obstetrics & Gynecology

## 2014-10-08 ENCOUNTER — Encounter: Payer: Self-pay | Admitting: Obstetrics & Gynecology

## 2014-10-08 VITALS — BP 112/69 | HR 79 | Wt 192.0 lb

## 2014-10-08 DIAGNOSIS — Z3490 Encounter for supervision of normal pregnancy, unspecified, unspecified trimester: Secondary | ICD-10-CM

## 2014-10-08 NOTE — Progress Notes (Signed)
Routine visit. Good FM. Still having back pain. I rec'd chiro and IBU prn til 30 weeks. Still declines flu vaccine. Glucola, labs, tdap at next visit.

## 2014-11-04 ENCOUNTER — Encounter: Payer: Self-pay | Admitting: Obstetrics & Gynecology

## 2014-11-07 ENCOUNTER — Ambulatory Visit (INDEPENDENT_AMBULATORY_CARE_PROVIDER_SITE_OTHER): Payer: Self-pay | Admitting: Family

## 2014-11-07 VITALS — BP 123/80 | HR 81 | Wt 202.0 lb

## 2014-11-07 DIAGNOSIS — O3421 Maternal care for scar from previous cesarean delivery: Secondary | ICD-10-CM

## 2014-11-07 DIAGNOSIS — Z23 Encounter for immunization: Secondary | ICD-10-CM

## 2014-11-07 DIAGNOSIS — Z3493 Encounter for supervision of normal pregnancy, unspecified, third trimester: Secondary | ICD-10-CM

## 2014-11-07 DIAGNOSIS — O34219 Maternal care for unspecified type scar from previous cesarean delivery: Secondary | ICD-10-CM | POA: Insufficient documentation

## 2014-11-07 DIAGNOSIS — Z3490 Encounter for supervision of normal pregnancy, unspecified, unspecified trimester: Secondary | ICD-10-CM

## 2014-11-07 LAB — CBC
HEMATOCRIT: 38 % (ref 36.0–46.0)
Hemoglobin: 13 g/dL (ref 12.0–15.0)
MCH: 31.5 pg (ref 26.0–34.0)
MCHC: 34.2 g/dL (ref 30.0–36.0)
MCV: 92 fL (ref 78.0–100.0)
MPV: 9.4 fL (ref 8.6–12.4)
PLATELETS: 217 10*3/uL (ref 150–400)
RBC: 4.13 MIL/uL (ref 3.87–5.11)
RDW: 12.1 % (ref 11.5–15.5)
WBC: 8.2 10*3/uL (ref 4.0–10.5)

## 2014-11-07 MED ORDER — TETANUS-DIPHTH-ACELL PERTUSSIS 5-2.5-18.5 LF-MCG/0.5 IM SUSP
0.5000 mL | Freq: Once | INTRAMUSCULAR | Status: AC
  Administered 2014-11-07: 0.5 mL via INTRAMUSCULAR

## 2014-11-07 MED ORDER — MEDICAL COMPRESSION THIGH HIGH MISC: Status: DC

## 2014-11-07 NOTE — Progress Notes (Signed)
See weight gain   Pt stated that she weighed 192 last month not 182lb  Weight gain is 10 lbs in 4 weeks

## 2014-11-07 NOTE — Progress Notes (Signed)
Reports feeling increased pressure in the evenings.  Feels approximately 5-6 at night that resolves.  Cervix - closed/thick .  Reviewed signs of preterm labor.  1 hr test completed today.  RX given for thigh high compression hose.

## 2014-11-08 LAB — GLUCOSE TOLERANCE, 1 HOUR (50G) W/O FASTING: GLUCOSE 1 HOUR GTT: 47 mg/dL — AB (ref 70–140)

## 2014-11-08 LAB — HIV ANTIBODY (ROUTINE TESTING W REFLEX): HIV: NONREACTIVE

## 2014-11-08 LAB — RPR

## 2014-11-09 ENCOUNTER — Encounter: Payer: Self-pay | Admitting: Advanced Practice Midwife

## 2014-11-10 ENCOUNTER — Telehealth: Payer: Self-pay | Admitting: *Deleted

## 2014-11-10 NOTE — Telephone Encounter (Signed)
Called pt to adv GTT results. Pt expressed understanding.

## 2014-11-12 ENCOUNTER — Ambulatory Visit: Payer: BC Managed Care – PPO | Admitting: *Deleted

## 2014-11-20 ENCOUNTER — Ambulatory Visit (INDEPENDENT_AMBULATORY_CARE_PROVIDER_SITE_OTHER): Payer: BLUE CROSS/BLUE SHIELD | Admitting: Obstetrics & Gynecology

## 2014-11-20 VITALS — BP 102/75 | HR 92 | Wt 204.0 lb

## 2014-11-20 DIAGNOSIS — Z3483 Encounter for supervision of other normal pregnancy, third trimester: Secondary | ICD-10-CM

## 2014-11-20 DIAGNOSIS — E162 Hypoglycemia, unspecified: Secondary | ICD-10-CM

## 2014-11-20 MED ORDER — GLUCOSE BLOOD VI STRP: ORAL_STRIP | Status: DC

## 2014-11-20 MED ORDER — ACCU-CHEK MULTICLIX LANCETS MISC: Status: DC

## 2014-11-20 NOTE — Progress Notes (Signed)
Pt having lightheadedness atfer carbs (does not have GDM).  Glucola was 47.  Is eating mainly protein.  Had this problem with last pregnancy.  She is folowing the dietician's recommendations from that event.  Will speak with dietician and FP MD.

## 2014-11-20 NOTE — Progress Notes (Signed)
Pt states she feels like she is going to pass out after eating any meals but feels worse after eating carbs.  Urine dip shows large Ketones

## 2014-11-21 ENCOUNTER — Telehealth: Payer: Self-pay | Admitting: Obstetrics and Gynecology

## 2014-11-21 NOTE — Telephone Encounter (Signed)
Message left for [facility] to [facility] , so appt to see jferguson could be arranged.

## 2014-11-25 ENCOUNTER — Other Ambulatory Visit: Payer: Self-pay | Admitting: Obstetrics & Gynecology

## 2014-11-25 DIAGNOSIS — E162 Hypoglycemia, unspecified: Secondary | ICD-10-CM

## 2014-11-25 NOTE — Progress Notes (Signed)
Pt needs referral to endocrinology for persistent hypoglycemia when she eat carbohydrates.

## 2014-12-01 ENCOUNTER — Telehealth: Payer: Self-pay | Admitting: Obstetrics and Gynecology

## 2014-12-01 NOTE — Telephone Encounter (Signed)
Pt informed Dr. Emelda FearFerguson out of office until tomorrow afternoon. Will leave message with Dr. Emelda FearFerguson nurse, pt returning his call.

## 2014-12-03 NOTE — Telephone Encounter (Signed)
Phone call to pt, offer made to see pt here in [place] office in prep for assisting with cesarean. Pt will see Dr Penne LashLeggett tomorrow, then make an appt here in [place] just to discuss incision , and what tuck could be reasonable at the time of cesarean. Pt will make appt.

## 2014-12-04 ENCOUNTER — Ambulatory Visit (INDEPENDENT_AMBULATORY_CARE_PROVIDER_SITE_OTHER): Payer: BLUE CROSS/BLUE SHIELD | Admitting: Obstetrics & Gynecology

## 2014-12-04 VITALS — BP 98/66 | HR 82 | Wt 207.0 lb

## 2014-12-04 DIAGNOSIS — Z3493 Encounter for supervision of normal pregnancy, unspecified, third trimester: Secondary | ICD-10-CM

## 2014-12-04 NOTE — Progress Notes (Signed)
Pt states she has had a 9lb weight gain during the day without wearing compression stockings. 4lb weight gain with compression

## 2014-12-04 NOTE — Progress Notes (Signed)
Having swelling in LE and varicose veins.   No evidence DVT.  Under care Dr. Arbie CookeyEarly and had vein procedures in the past.  Continue compression stockings.  Gave information on DVT and signs and symptoms to look out for. Endocrinologist for hypoglycemia Appt for Dr. Emelda FearFerguson to eval for panniculectomy Medicaid papers signed for BTL incase she ends up qualifying  For Medicaid.

## 2014-12-04 NOTE — Patient Instructions (Signed)
Venous Thromboembolism, Prevention A venous thromboembolism is a blood clot that forms in a vein. A blood clot in a deep vein is called a deep venous thrombosis (DVT). A blood clot in the lungs is called a pulmonary embolism (PE). Blood clots are dangerous and can cause death. Blood clots can form in the:  Lungs.  Legs.  Arms. CAUSES  A blood clot can form in a vein from different conditions. A blood clot can develop due to:  Blood flow within a vein that is sluggish or very slow.  Medical conditions that make the blood clot easily.  Vein damage. RISK FACTORS Risk factors can increase your risk of developing a blood clot. Risk factors can include:  Smoking.  Obesity.  Age.  Immobility or sedentary lifestyle.  Sitting or standing for long periods of time.  Chronic or long-term bedrest.  Medical or past history of blood clots.  Family history of blood clots.  Hip, leg, or pelvis injury or trauma.  Major surgery, especially surgery on the hip, knee, or abdomen.  Pregnancy and childbirth.  Birth control pills and hormone replacement therapy.  Medical conditions such as  Peripheral vascular disease (PVD).  Diabetes.  Cancer. SYMPTOMS  Symptoms of VTE can depend on where the clot is located and if the clot breaks off and travels to another organ. Sometimes, there may be no symptoms.   DVT symptoms can include:  Swelling of the leg or arm, especially on one side.  Warmth and redness of the leg or arm, especially on one side.  Pain in an arm or leg. Leg pain may be more noticeable or worse when standing or walking.  PE symptoms can include:  Shortness of breath.  Coughing.  Coughing up blood or blood-tinged mucus (hemoptysis).  Chest pain or chest pain with deep breaths (pleuritic chest pain).  Apprehension, anxiety, or a feeling of impending doom.  Rapid heartbeat. PREVENTION  Exercise regularly. Take a brisk 30 minute walk every day. Staying  active and moving around can help prevent blood clots.  Avoid sitting or lying in bed for long periods of time. Change your position often, especially during a long trip.  Women, especially those over the age of 35, should consider the risks and benefits of taking estrogen medicines. This includes birth control pills and hormone replacement therapy.  Do not smoke, especially if you take estrogen medicines. If you smoke, talk to your caregiver on how to quit.  Eat plenty of fruits and vegetables. Ask your caregiver or dietitian if there are foods you should avoid.  Maintain a weight as suggested by your caregiver.  Wear loose-fitting clothing. Avoid constrictive or tight clothing around your legs or waist.  Try not to bump or injure your legs. Avoid crossing your legs when you are sitting.  Do not use pillows under your knees unless told by your caregiver.  Take all medicines that your caregiver prescribes you.  Wear special stockings (compression stockings or TED hose) if your caregiver prescribes them.  Wearing compression stockings (support hose) can make the leg veins more narrow. This increases blood flow in the legs and can help prevent blood clots.  It is important to wear compression stockings correctly. Do not let them bunch up when you are wearing them. TRAVEL Long distance travel can increase the risk of a blood clot. To prevent a blood clot when traveling:  You should exercise your legs by walking or by pumping your muscles every hour. To help prevent poor   circulation on long trips, stand, stretch, and walk up and down the aisle of your airplane, train, or bus as often as possible to get the blood moving.  Do squats if you are able. If you are unable to do squats, raise your foot on the balls of your feet and tighten your lower leg muscles (particularly the calve muscles) while seated. Pointing (flexing and extending) your toes while tightening your calves while seated are  also good exercises to do every hour during long trips. They help increase blood flow and reduce risk of DVT.  Stay well hydrated. Drink water regularly when traveling, especially when you are sitting or immobile for long periods of time.  Use of drugs to prevent DVT during routine travel is not generally recommended. Before taking any drugs to reduce risk of DVT, consult your caregiver. SURGERY AND HOSPITALIZATION  People who are at high risk for a blood clot may be given a blood thinning medicine (anticoagulant) when they are hospitalized even if they are not going to have surgery.  A long trip prior to surgery can increase the risk of a clot for patients undergoing hip and knee replacements. Talk to your caregiver about travel plans before your surgery.  After hip or knee surgery, your caregiver may give you anticoagulants to help prevent blood clots.  Anticoagulants may be given to people at high risk of developing thromboembolism, before, during, or sometimes after surgery, including people with clotting disorders or with a history of past thromboembolism. TRAVEL AFTER SURGERY  In orthopedic surgery, the cutting of bones prompts the body to increase clotting factors in the blood. Due to the size of the bones involved in hip and knee replacements, there is a higher risk of blood clotting than other orthopedic surgeries.  There is a risk of clotting for up to 4-6 weeks after surgery. Flying or traveling long distances can increase your risk of a clot. As a result, those who travel long distances may need additional preventive measures after their procedure.  Drink only non-alcoholic beverages during your flight, train, or car travel. Alcohol can dehydrate you and increase your risk of getting blood clots. SEEK IMMEDIATE MEDICAL CARE IF:   You develop chest pain.  You develop severe shortness of breath.  You have breathing problems after traveling.  You develop swelling or pain in the  leg.  You begin to cough up bloody mucus or phlegm (sputum).  You feel dizzy or faint. Document Released: 08/31/2009 Document Revised: 06/06/2012 Document Reviewed: 08/31/2009 Exit[employer] Patient Information 2015 [employer]. This information is not intended to replace advice given to you by your health care provider. Make sure you discuss any questions you have with your health care provider.  

## 2014-12-09 ENCOUNTER — Telehealth: Payer: Self-pay | Admitting: Obstetrics and Gynecology

## 2014-12-09 NOTE — Telephone Encounter (Signed)
Inadvertent note, I was checking to see if pt had made appt [facility] yet [facility]

## 2014-12-11 ENCOUNTER — Ambulatory Visit (INDEPENDENT_AMBULATORY_CARE_PROVIDER_SITE_OTHER): Payer: BLUE CROSS/BLUE SHIELD | Admitting: Obstetrics and Gynecology

## 2014-12-11 ENCOUNTER — Encounter: Payer: Self-pay | Admitting: Obstetrics and Gynecology

## 2014-12-11 VITALS — BP 118/70 | HR 84 | Wt 210.4 lb

## 2014-12-11 DIAGNOSIS — L574 Cutis laxa senilis: Secondary | ICD-10-CM

## 2014-12-11 DIAGNOSIS — O34219 Maternal care for unspecified type scar from previous cesarean delivery: Secondary | ICD-10-CM

## 2014-12-11 DIAGNOSIS — L905 Scar conditions and fibrosis of skin: Secondary | ICD-10-CM | POA: Diagnosis not present

## 2014-12-11 NOTE — Progress Notes (Signed)
Patient ID: Tara DollyShawna L Sowle, female   DOB: 1981/09/04, 34 y.o.   MRN: [id number]  This chart was scribed for Tilda BurrowJohn Caily Rakers V, MD by Carl Bestelina Holson, ED Scribe. This patient was seen in Room 1 and the patient's care was started at 9:31 AM.    [facility] Visit  Patient name: Tara Rocha MRN [id number]  Date of birth: 1981/09/04  CC & HPI:  Tara DollyShawna L Masek is a 34 y.o. female presenting today to have her c-section incision checked.   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 She lost 100 pounds prior to pregnancy and this contributes to the laxity and chronic irritation and discomfort. 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 Patient is given a detailed discussion of the risk, potential benefits and technique used for wide excision of old cicatrix. JP drain is used in the sub-q space after wide excision, and pt is familiar with these, having had one at her first cesarean.  ROS:  All systems have been reviewed and are negative unless otherwise specified in the HPI.  Pertinent History Reviewed:   Reviewed: Significant for  Medical         Past Medical History  Diagnosis Date  . Poor circulation   . History of staph infection 08-2011    Required surgical excision (Left thigh)  . Varicose veins   . Leg pain                               Surgical Hx:    Past Surgical History  Procedure Laterality Date  . Tonsillectomy      age 89  . Cesarean section  2008, 2012  . Leg skin lesion  biopsy / excision  08-2011    Left upper thigh skin excision for Staph Infection  . Endovenous ablation saphenous vein w/ laser Left 10-03-2013    left greater saphenous vein and stab phlebectomies > 20 incisions left leg by Gretta Beganodd Early MD   . Endovenous ablation  saphenous vein w/ laser Right 10-17-2013    endovenous laser ablation with stab phlebectomy 10-20 incisions right leg   Medications: Reviewed & Updated - see associated section                       Current outpatient prescriptions:  .  Cholecalciferol (VITAMIN D3) 2000 UNITS capsule, Take 1,000 mg by mouth., Disp: , Rfl:  .  folic acid (FOLVITE) 1 MG tablet, Take 1 mg by mouth daily., Disp: , Rfl:  .  glucose blood test strip, Use as instructed, Disp: 100 each, Rfl: 12 .  Lancets (ACCU-CHEK MULTICLIX) lancets, Use as instructed, Disp: 100 each, Rfl: 12 .  magnesium 30 MG tablet, Take 30 mg by mouth 2 (two) times daily., Disp: , Rfl:  .  prenatal vitamin w/FE, FA (PRENATAL 1 + 1) 27-1 MG TABS tablet, Take 1 tablet by mouth daily at 12 noon., Disp: , Rfl:  .  Elastic Bandages & Supports (MEDICAL COMPRESSION THIGH HIGH) MISC, 40 - 50 compression (Patient not taking: Reported on 12/11/2014), Disp: 1 each, Rfl: 0   Social History: Reviewed -  reports that she has never smoked. She has never used smokeless tobacco.  Objective Findings:  Vitals: Blood pressure 118/70, pulse 84, weight 210 lb 6.4 oz (95.437 kg), last menstrual period 04/20/2014. Prenatal care to continue at [facility] office, no obstetric complaints today, good fetal movement and no pt concerns. Physical Examination: Abdomen - Gravid uterus c/w dates, soft, nontender, well-healed surgical scar in area of lower abdominal skin laxity, with distinct retraction of old cesarean scar in the center of the laxity..    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 Sketched a 45cm longx15cm wide elipses skin and underlying tissue that could be removed, to improve abdominal incision postop care and subsequent skin care longterm, and patient agrees this is the sort of change she is seeking.  Slight unavoidable increased risk of wound complications due to the  increased incision size explained and accepted by the patient.  P:  1. Repeat cesarean section and bilateral tubal ligation with wide excision of prior scar scheduled tentatively for Dr Penne Lash for 9:30 AM on Monday, April 25th. Patient to confirm this time and date with Dr Penne Lash at follow up prenatal appointment. Pt understands that if Dr Leggett's schedule does not allow her to be available, I will be available as needed for the cesarean.

## 2014-12-11 NOTE — Progress Notes (Signed)
Pt here today to have incision checked and discuss what Dr, Emelda FearFerguson can do to help with her c-section.

## 2014-12-18 ENCOUNTER — Ambulatory Visit (INDEPENDENT_AMBULATORY_CARE_PROVIDER_SITE_OTHER): Payer: BLUE CROSS/BLUE SHIELD | Admitting: Obstetrics & Gynecology

## 2014-12-18 VITALS — BP 107/63 | HR 70 | Wt 211.0 lb

## 2014-12-18 DIAGNOSIS — Z3492 Encounter for supervision of normal pregnancy, unspecified, second trimester: Secondary | ICD-10-CM

## 2014-12-18 NOTE — Progress Notes (Signed)
Routine visit. Good FM. She saw the endocrinologist and she is eating small frequent meals. Her C/S and BTL are scheduled with Dr. Emelda FearFerguson who will also do a tummy tuck. Cultures at next visit.

## 2015-01-01 ENCOUNTER — Ambulatory Visit (INDEPENDENT_AMBULATORY_CARE_PROVIDER_SITE_OTHER): Payer: BLUE CROSS/BLUE SHIELD | Admitting: Obstetrics & Gynecology

## 2015-01-01 ENCOUNTER — Encounter: Payer: Self-pay | Admitting: Obstetrics & Gynecology

## 2015-01-01 VITALS — BP 126/72 | HR 73 | Wt 216.0 lb

## 2015-01-01 DIAGNOSIS — Z3490 Encounter for supervision of normal pregnancy, unspecified, unspecified trimester: Secondary | ICD-10-CM

## 2015-01-01 DIAGNOSIS — Z36 Encounter for antenatal screening of mother: Secondary | ICD-10-CM | POA: Diagnosis not present

## 2015-01-01 NOTE — Progress Notes (Signed)
Routine visit. Good FM. No problems except some sciatica when baby moves. I showed her some hip-opening exercises. Cultures obtained today.

## 2015-01-02 ENCOUNTER — Encounter: Payer: BLUE CROSS/BLUE SHIELD | Admitting: Advanced Practice Midwife

## 2015-01-02 LAB — GC/CHLAMYDIA PROBE AMP
CT PROBE, AMP APTIMA: NEGATIVE
GC PROBE AMP APTIMA: NEGATIVE

## 2015-01-05 LAB — CULTURE, STREPTOCOCCUS GRP B W/SUSCEPT

## 2015-01-07 ENCOUNTER — Encounter: Payer: BLUE CROSS/BLUE SHIELD | Admitting: Obstetrics & Gynecology

## 2015-01-12 ENCOUNTER — Ambulatory Visit (INDEPENDENT_AMBULATORY_CARE_PROVIDER_SITE_OTHER): Payer: BLUE CROSS/BLUE SHIELD | Admitting: Obstetrics & Gynecology

## 2015-01-12 ENCOUNTER — Encounter: Payer: Self-pay | Admitting: *Deleted

## 2015-01-12 VITALS — BP 122/79 | HR 82 | Wt 219.0 lb

## 2015-01-12 DIAGNOSIS — Z3483 Encounter for supervision of other normal pregnancy, third trimester: Secondary | ICD-10-CM | POA: Diagnosis not present

## 2015-01-12 DIAGNOSIS — Z3493 Encounter for supervision of normal pregnancy, unspecified, third trimester: Secondary | ICD-10-CM

## 2015-01-12 NOTE — Progress Notes (Signed)
Saw endocrinologist and eating 5 meals a day.  Hypoglycemia is better.  Will f/u in July.  C/S next Monday.

## 2015-01-16 ENCOUNTER — Encounter (HOSPITAL_COMMUNITY)
Admission: RE | Admit: 2015-01-16 | Discharge: 2015-01-16 | Disposition: A | Discharge status: Home / Self Care | Payer: BLUE CROSS/BLUE SHIELD | Source: Ambulatory Visit | Attending: Obstetrics & Gynecology | Admitting: Obstetrics & Gynecology

## 2015-01-16 ENCOUNTER — Encounter (HOSPITAL_COMMUNITY): Payer: Self-pay

## 2015-01-16 VITALS — BP 107/82 | Temp 98.4°F | Resp 18 | Ht 65.0 in | Wt 222.0 lb

## 2015-01-16 DIAGNOSIS — Z01818 Encounter for other preprocedural examination: Secondary | ICD-10-CM | POA: Insufficient documentation

## 2015-01-16 DIAGNOSIS — O34219 Maternal care for unspecified type scar from previous cesarean delivery: Secondary | ICD-10-CM

## 2015-01-16 HISTORY — DX: Headache, unspecified: R51.9

## 2015-01-16 HISTORY — DX: Hypoglycemia, unspecified: E16.2

## 2015-01-16 HISTORY — DX: Major depressive disorder, single episode, unspecified: F32.9

## 2015-01-16 HISTORY — DX: Headache: R51

## 2015-01-16 LAB — CBC
HCT: 35.4 % — ABNORMAL LOW (ref 36.0–46.0)
Hemoglobin: 12.3 g/dL (ref 12.0–15.0)
MCH: 30.8 pg (ref 26.0–34.0)
MCHC: 34.7 g/dL (ref 30.0–36.0)
MCV: 88.7 fL (ref 78.0–100.0)
PLATELETS: 175 10*3/uL (ref 150–400)
RBC: 3.99 MIL/uL (ref 3.87–5.11)
RDW: 12.1 % (ref 11.5–15.5)
WBC: 9.7 10*3/uL (ref 4.0–10.5)

## 2015-01-16 LAB — BASIC METABOLIC PANEL
Anion gap: 6 (ref 5–15)
BUN: 12 mg/dL (ref 6–23)
CO2: 25 mmol/L (ref 19–32)
Calcium: 8.2 mg/dL — ABNORMAL LOW (ref 8.4–10.5)
Chloride: 107 mmol/L (ref 96–112)
Creatinine, Ser: 0.65 mg/dL (ref 0.50–1.10)
GFR calc Af Amer: >90 mL/min (ref >90)
GFR calc non Af Amer: >90 mL/min (ref >90)
GLUCOSE: 82 mg/dL (ref 70–99)
POTASSIUM: 4 mmol/L (ref 3.5–5.1)
SODIUM: 138 mmol/L (ref 135–145)

## 2015-01-16 NOTE — Patient Instructions (Signed)
Your procedure is scheduled on:01/19/15  Enter through the Main Entrance at :8am Pick up desk phone and dial [phone] and inform us of your arrival.  Please call [phone] if you have any problems the morning of surgery.  Remember: Do not eat food or drink liquids, including water, after midnight:Sunday   You may brush your teeth the morning of surgery.   DO NOT wear jewelry, eye make-up, lipstick,body lotion, or dark fingernail polish.  (Polished toes are ok) You may wear deodorant.  If you are to be admitted after surgery, leave suitcase in car until your room has been assigned. Patients discharged on the day of surgery will not be allowed to drive home. Wear loose fitting, comfortable clothes for your ride home.

## 2015-01-17 LAB — RPR: RPR: NONREACTIVE

## 2015-01-18 NOTE — Anesthesia Preprocedure Evaluation (Addendum)
Anesthesia Evaluation  Patient identified by MRN, date of birth, ID band Patient awake    Reviewed: Allergy & Precautions, NPO status , Patient's Chart, lab work & pertinent test results, reviewed documented beta blocker date and time   Airway Mallampati: I   Neck ROM: Full    Dental  (+) Teeth Intact, Dental Advisory Given   Pulmonary  breath sounds clear to auscultation        Cardiovascular negative cardio ROS  Rhythm:Regular     Neuro/Psych Depression    GI/Hepatic negative GI ROS, Neg liver ROS,   Endo/Other  Hx hypoglycemia    Renal/GU      Musculoskeletal   Abdominal (+)  Abdomen: soft.    Peds  Hematology 12/35 Plts 175   Anesthesia Other Findings   Reproductive/Obstetrics (+) Pregnancy GBS+                            Anesthesia Physical Anesthesia Plan  ASA: II  Anesthesia Plan: Combined Spinal and Epidural   Post-op Pain Management:    Induction:   Airway Management Planned: Natural Airway  Additional Equipment:   Intra-op Plan:   Post-operative Plan:   Informed Consent: I have reviewed the patients History and Physical, chart, labs and discussed the procedure including the risks, benefits and alternatives for the proposed anesthesia with the patient or authorized representative who has indicated his/her understanding and acceptance.     Plan Discussed with:   Anesthesia Plan Comments:        Anesthesia Quick Evaluation

## 2015-01-18 NOTE — H&P (Addendum)
Tara Rocha is a 34 y.o. female presenting for scheduled repeat cesarean section and BTL.  Pt also undergoing a wide excision of cicatrix and placement of drains with Dr. Emelda Fear due to redundant skin from >100 pound weight loss.  This will also aid in wound healing.  History OB History    Gravida Para Term Preterm AB TAB SAB Ectopic Multiple Living   Past Medical History  Diagnosis Date  . Poor circulation   . History of staph infection 08-2011    Required surgical excision (Left thigh)  . Varicose veins   . Leg pain   . Hypoglycemia   . 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 Headache     migraines   Past Surgical History  Procedure Laterality Date  . Tonsillectomy      age 63  . Cesarean section  2008, 2012  . Leg skin lesion  biopsy / excision  08-2011    Left upper thigh skin excision for Staph Infection  . Endovenous ablation saphenous vein w/ laser Left 10-03-2013    left greater saphenous vein and stab phlebectomies > 20 incisions left leg by [name] MD   . Endovenous ablation saphenous vein w/ laser Right 10-17-2013    endovenous laser ablation with stab phlebectomy 10-20 incisions right leg   Family History: family history includes Anemia in her mother; Cancer in her paternal grandmother; Other in her mother and sister; Thyroid disease in her maternal grandmother. Social History:  reports that she has never smoked. She has never used smokeless tobacco. She reports that she does not drink alcohol or use illicit drugs.   Prenatal Transfer Tool  Maternal Diabetes: No Genetic Screening: Normal (only had the NT) Maternal Ultrasounds/Referrals: Normal Fetal Ultrasounds or other Referrals:  None Maternal Substance Abuse:  No Significant Maternal Medications:  None Significant Maternal Lab Results:  None Other Comments:  None  Review of Systems  Constitutional: Negative for fever and chills.  Eyes: Negative for blurred vision.   Respiratory: Negative.   Cardiovascular: Negative.   Gastrointestinal: Negative.   Musculoskeletal: Negative.   Skin: Negative for rash.  Neurological: Negative.  Negative for headaches.  Psychiatric/Behavioral: Negative.       Last menstrual period 04/20/2014. Exam Physical Exam  Vitals reviewed. Constitutional: She is oriented to person, place, and time. She appears well-developed and well-nourished.  HENT:  Head: Normocephalic.  Eyes: Conjunctivae are normal.  Neck: Neck supple.  Cardiovascular: Normal rate and regular rhythm.   Respiratory: Effort normal.  GI: Soft. She exhibits no distension and no mass. There is no tenderness. There is no rebound and no guarding.  Musculoskeletal: She exhibits no edema or tenderness.  Neurological: She is alert and oriented to person, place, and time.  Skin: Skin is warm and dry.  Psychiatric: She has a normal mood and affect.    Filed Vitals:   01/19/15 0809  BP: 135/79  Pulse: 92  Temp: 97.9 F (36.6 C)  TempSrc: Oral  Resp: 16  SpO2: 100%    Prenatal labs: ABO, Rh: --/--/O POS (04/22 1630) Antibody: NEG (04/22 1630) Rubella: 3.66 (09/23 1038) RPR: Non Reactive (04/22 1630)  HBsAg: NEGATIVE (09/23 1038)  HIV: NONREACTIVE (02/12 0945)  GBS:     Assessment/Plan: 34 yo G4P2012 at 39 weeks and 1 day for 3rd cesarean section and BTL.  Patient having a abdominoplasty by Dr. Emelda Fear for recent >  100 pound weight loss.    The risks of cesarean section discussed with the patient included but were not limited to: bleeding which may require transfusion or reoperation; infection which may require antibiotics; injury to bowel, bladder, ureters or other surrounding organs; injury to the fetus; need for additional procedures including hysterectomy in the event of a life-threatening hemorrhage; placental abnormalities wth subsequent pregnancies, incisional problems, thromboembolic phenomenon and other postoperative/anesthesia  complications. The patient concurred with the proposed plan, giving informed written consent for the procedure.  Patient desires permanent sterilization.  Other reversible forms of contraception were discussed with patient; she declines all other modalities. Risks of procedure discussed with patient including but not limited to: risk of regret, permanence of method, bleeding, infection, injury to surrounding organs and need for additional procedures.  Failure risk of 0.5-1% with increased risk of ectopic gestation if pregnancy occurs was also discussed with patient.    Dr. Emelda FearFerguson will consent patient for the wide excision of the cicatrix.       Tara Rocha. 01/18/2015, 7:54 PM

## 2015-01-19 ENCOUNTER — Inpatient Hospital Stay (HOSPITAL_COMMUNITY): Payer: BLUE CROSS/BLUE SHIELD | Admitting: Anesthesiology

## 2015-01-19 ENCOUNTER — Encounter (HOSPITAL_COMMUNITY)
Admission: RE | Disposition: A | Discharge status: Home / Self Care | Payer: Self-pay | Source: Ambulatory Visit | Attending: Obstetrics & Gynecology

## 2015-01-19 ENCOUNTER — Encounter (HOSPITAL_COMMUNITY): Payer: Self-pay | Admitting: Emergency Medicine

## 2015-01-19 ENCOUNTER — Inpatient Hospital Stay (HOSPITAL_COMMUNITY)
Admission: RE | Admit: 2015-01-19 | Discharge: 2015-01-22 | DRG: 766 | Disposition: A | Discharge status: Home / Self Care | Payer: BLUE CROSS/BLUE SHIELD | Source: Ambulatory Visit | Attending: Obstetrics & Gynecology | Admitting: Obstetrics & Gynecology

## 2015-01-19 DIAGNOSIS — O3421 Maternal care for scar from previous cesarean delivery: Principal | ICD-10-CM | POA: Diagnosis present

## 2015-01-19 DIAGNOSIS — E65 Localized adiposity: Secondary | ICD-10-CM | POA: Diagnosis not present

## 2015-01-19 DIAGNOSIS — N858 Other specified noninflammatory disorders of uterus: Secondary | ICD-10-CM | POA: Diagnosis not present

## 2015-01-19 DIAGNOSIS — Z3A39 39 weeks gestation of pregnancy: Secondary | ICD-10-CM | POA: Diagnosis present

## 2015-01-19 DIAGNOSIS — Z98891 History of uterine scar from previous surgery: Secondary | ICD-10-CM

## 2015-01-19 DIAGNOSIS — Z302 Encounter for sterilization: Secondary | ICD-10-CM

## 2015-01-19 DIAGNOSIS — O9989 Other specified diseases and conditions complicating pregnancy, childbirth and the puerperium: Secondary | ICD-10-CM | POA: Diagnosis present

## 2015-01-19 HISTORY — PX: TUBAL LIGATION: SHX77

## 2015-01-19 LAB — PREPARE RBC (CROSSMATCH)

## 2015-01-19 SURGERY — Surgical Case
Anesthesia: Regional | Laterality: Bilateral

## 2015-01-19 MED ORDER — KETOROLAC TROMETHAMINE 30 MG/ML IJ SOLN: 30.0000 mg | Freq: Four times a day (QID) | INTRAMUSCULAR | Status: AC | PRN

## 2015-01-19 MED ORDER — DIPHENHYDRAMINE HCL 25 MG PO CAPS
25.0000 mg | ORAL_CAPSULE | Freq: Four times a day (QID) | ORAL | Status: DC | PRN
  Administered 2015-01-20 (×2): 25 mg via ORAL
  Filled 2015-01-19: qty 1

## 2015-01-19 MED ORDER — SCOPOLAMINE 1 MG/3DAYS TD PT72
1.0000 | MEDICATED_PATCH | Freq: Once | TRANSDERMAL | Status: DC
Start: 2015-01-19 — End: 2015-01-22

## 2015-01-19 MED ORDER — PROMETHAZINE HCL 25 MG/ML IJ SOLN: 6.2500 mg | INTRAMUSCULAR | Status: DC | PRN

## 2015-01-19 MED ORDER — SIMETHICONE 80 MG PO CHEW
80.0000 mg | CHEWABLE_TABLET | Freq: Three times a day (TID) | ORAL | Status: DC
  Administered 2015-01-19 – 2015-01-22 (×10): 80 mg via ORAL
  Filled 2015-01-19 (×9): qty 1

## 2015-01-19 MED ORDER — CEFAZOLIN SODIUM-DEXTROSE 2-3 GM-% IV SOLR
INTRAVENOUS | Status: AC
  Filled 2015-01-19: qty 50

## 2015-01-19 MED ORDER — NALBUPHINE HCL 10 MG/ML IJ SOLN: 5.0000 mg | Freq: Once | INTRAMUSCULAR | Status: AC | PRN

## 2015-01-19 MED ORDER — OXYTOCIN 10 UNIT/ML IJ SOLN
40.0000 [IU] | INTRAMUSCULAR | Status: DC | PRN
  Administered 2015-01-19: 40 [IU] via INTRAVENOUS

## 2015-01-19 MED ORDER — MEPERIDINE HCL 25 MG/ML IJ SOLN: 6.2500 mg | INTRAMUSCULAR | Status: DC | PRN

## 2015-01-19 MED ORDER — DIBUCAINE 1 % RE OINT: 1.0000 "application " | TOPICAL_OINTMENT | RECTAL | Status: DC | PRN

## 2015-01-19 MED ORDER — DEXTROSE 5 % IV SOLN
1.0000 ug/kg/h | INTRAVENOUS | Status: DC | PRN
  Filled 2015-01-19: qty 2

## 2015-01-19 MED ORDER — OXYCODONE-ACETAMINOPHEN 5-325 MG PO TABS
2.0000 | ORAL_TABLET | ORAL | Status: DC | PRN
  Administered 2015-01-19 – 2015-01-20 (×2): 2 via ORAL
  Filled 2015-01-19 (×2): qty 2

## 2015-01-19 MED ORDER — VITAMIN K1 1 MG/0.5ML IJ SOLN
INTRAMUSCULAR | Status: AC
  Filled 2015-01-19: qty 0.5

## 2015-01-19 MED ORDER — IBUPROFEN 600 MG PO TABS
600.0000 mg | ORAL_TABLET | Freq: Four times a day (QID) | ORAL | Status: DC
  Administered 2015-01-19 – 2015-01-22 (×11): 600 mg via ORAL
  Filled 2015-01-19 (×11): qty 1

## 2015-01-19 MED ORDER — FENTANYL CITRATE (PF) 100 MCG/2ML IJ SOLN
INTRAMUSCULAR | Status: AC
  Filled 2015-01-19: qty 2

## 2015-01-19 MED ORDER — SODIUM BICARBONATE 8.4 % IV SOLN
INTRAVENOUS | Status: DC | PRN
  Administered 2015-01-19: 3 mL via EPIDURAL
  Administered 2015-01-19 (×2): 2 mL via EPIDURAL
  Administered 2015-01-19: 3 mL via EPIDURAL

## 2015-01-19 MED ORDER — PRENATAL MULTIVITAMIN CH
1.0000 | ORAL_TABLET | Freq: Every day | ORAL | Status: DC
  Administered 2015-01-20 – 2015-01-22 (×3): 1 via ORAL
  Filled 2015-01-19 (×3): qty 1

## 2015-01-19 MED ORDER — PHENYLEPHRINE 8 MG IN D5W 100 ML (0.08MG/ML) PREMIX OPTIME
INJECTION | INTRAVENOUS | Status: AC
  Filled 2015-01-19: qty 100

## 2015-01-19 MED ORDER — LANOLIN HYDROUS EX OINT: 1.0000 "application " | TOPICAL_OINTMENT | CUTANEOUS | Status: DC | PRN

## 2015-01-19 MED ORDER — NALOXONE HCL 0.4 MG/ML IJ SOLN: 0.4000 mg | INTRAMUSCULAR | Status: DC | PRN

## 2015-01-19 MED ORDER — SCOPOLAMINE 1 MG/3DAYS TD PT72
MEDICATED_PATCH | TRANSDERMAL | Status: AC
  Administered 2015-01-19: 1.5 mg via TRANSDERMAL
  Filled 2015-01-19: qty 1

## 2015-01-19 MED ORDER — OXYCODONE-ACETAMINOPHEN 5-325 MG PO TABS: 1.0000 | ORAL_TABLET | ORAL | Status: DC | PRN

## 2015-01-19 MED ORDER — HYDROMORPHONE HCL 1 MG/ML IJ SOLN: 1.0000 mg | INTRAMUSCULAR | Status: DC | PRN

## 2015-01-19 MED ORDER — ONDANSETRON HCL 4 MG/2ML IJ SOLN
INTRAMUSCULAR | Status: DC | PRN
  Administered 2015-01-19: 4 mg via INTRAVENOUS

## 2015-01-19 MED ORDER — NALBUPHINE HCL 10 MG/ML IJ SOLN
5.0000 mg | INTRAMUSCULAR | Status: DC | PRN
  Filled 2015-01-19: qty 1

## 2015-01-19 MED ORDER — SENNOSIDES-DOCUSATE SODIUM 8.6-50 MG PO TABS
2.0000 | ORAL_TABLET | ORAL | Status: DC
  Administered 2015-01-19 – 2015-01-21 (×3): 2 via ORAL
  Filled 2015-01-19 (×3): qty 2

## 2015-01-19 MED ORDER — SCOPOLAMINE 1 MG/3DAYS TD PT72
1.0000 | MEDICATED_PATCH | TRANSDERMAL | Status: DC
  Administered 2015-01-19: 1.5 mg via TRANSDERMAL

## 2015-01-19 MED ORDER — LACTATED RINGERS IV SOLN
INTRAVENOUS | Status: DC | PRN
  Administered 2015-01-19: 10:00:00 via INTRAVENOUS

## 2015-01-19 MED ORDER — BUPIVACAINE IN DEXTROSE 0.75-8.25 % IT SOLN
INTRATHECAL | Status: DC | PRN
  Administered 2015-01-19: 12 mL via INTRATHECAL

## 2015-01-19 MED ORDER — SIMETHICONE 80 MG PO CHEW: 80.0000 mg | CHEWABLE_TABLET | ORAL | Status: DC | PRN

## 2015-01-19 MED ORDER — ACETAMINOPHEN 500 MG PO TABS
1000.0000 mg | ORAL_TABLET | Freq: Four times a day (QID) | ORAL | Status: AC
  Administered 2015-01-19 – 2015-01-20 (×2): 1000 mg via ORAL
  Filled 2015-01-19 (×3): qty 2

## 2015-01-19 MED ORDER — KETOROLAC TROMETHAMINE 30 MG/ML IJ SOLN
INTRAMUSCULAR | Status: AC
  Administered 2015-01-19: 30 mg via INTRAVENOUS
  Filled 2015-01-19: qty 1

## 2015-01-19 MED ORDER — ONDANSETRON HCL 4 MG/2ML IJ SOLN
INTRAMUSCULAR | Status: AC
  Filled 2015-01-19: qty 2

## 2015-01-19 MED ORDER — NALBUPHINE HCL 10 MG/ML IJ SOLN
5.0000 mg | Freq: Once | INTRAMUSCULAR | Status: AC | PRN
  Administered 2015-01-19: 5 mg via SUBCUTANEOUS

## 2015-01-19 MED ORDER — ACETAMINOPHEN 325 MG PO TABS
650.0000 mg | ORAL_TABLET | ORAL | Status: DC | PRN
  Administered 2015-01-22: 650 mg via ORAL
  Filled 2015-01-19: qty 2

## 2015-01-19 MED ORDER — CEFAZOLIN SODIUM-DEXTROSE 2-3 GM-% IV SOLR
2.0000 g | INTRAVENOUS | Status: AC
  Administered 2015-01-19: 2 g via INTRAVENOUS

## 2015-01-19 MED ORDER — OXYTOCIN 40 UNITS IN LACTATED RINGERS INFUSION - SIMPLE MED: 62.5000 mL/h | INTRAVENOUS | Status: AC

## 2015-01-19 MED ORDER — DEXAMETHASONE SODIUM PHOSPHATE 10 MG/ML IJ SOLN
INTRAMUSCULAR | Status: DC | PRN
  Administered 2015-01-19: 4 mg via INTRAVENOUS

## 2015-01-19 MED ORDER — LACTATED RINGERS IV SOLN
INTRAVENOUS | Status: DC
  Administered 2015-01-19: 17:00:00 via INTRAVENOUS

## 2015-01-19 MED ORDER — WITCH HAZEL-GLYCERIN EX PADS: 1.0000 "application " | MEDICATED_PAD | CUTANEOUS | Status: DC | PRN

## 2015-01-19 MED ORDER — ZOLPIDEM TARTRATE 5 MG PO TABS: 5.0000 mg | ORAL_TABLET | Freq: Every evening | ORAL | Status: DC | PRN

## 2015-01-19 MED ORDER — OXYTOCIN 10 UNIT/ML IJ SOLN
INTRAMUSCULAR | Status: AC
  Filled 2015-01-19: qty 4

## 2015-01-19 MED ORDER — DIPHENHYDRAMINE HCL 25 MG PO CAPS
25.0000 mg | ORAL_CAPSULE | ORAL | Status: DC | PRN
  Filled 2015-01-19: qty 1

## 2015-01-19 MED ORDER — PHENYLEPHRINE 8 MG IN D5W 100 ML (0.08MG/ML) PREMIX OPTIME
INJECTION | INTRAVENOUS | Status: DC | PRN
  Administered 2015-01-19: 60 ug/min via INTRAVENOUS

## 2015-01-19 MED ORDER — MORPHINE SULFATE 0.5 MG/ML IJ SOLN
INTRAMUSCULAR | Status: AC
  Filled 2015-01-19: qty 10

## 2015-01-19 MED ORDER — SODIUM CHLORIDE 0.9 % IJ SOLN: 3.0000 mL | INTRAMUSCULAR | Status: DC | PRN

## 2015-01-19 MED ORDER — NALBUPHINE HCL 10 MG/ML IJ SOLN
INTRAMUSCULAR | Status: AC
  Administered 2015-01-19: 5 mg via SUBCUTANEOUS
  Filled 2015-01-19: qty 1

## 2015-01-19 MED ORDER — MENTHOL 3 MG MT LOZG: 1.0000 | LOZENGE | OROMUCOSAL | Status: DC | PRN

## 2015-01-19 MED ORDER — LACTATED RINGERS IV SOLN
INTRAVENOUS | Status: DC
  Administered 2015-01-19 (×3): via INTRAVENOUS

## 2015-01-19 MED ORDER — ONDANSETRON HCL 4 MG/2ML IJ SOLN: 4.0000 mg | Freq: Three times a day (TID) | INTRAMUSCULAR | Status: DC | PRN

## 2015-01-19 MED ORDER — KETOROLAC TROMETHAMINE 30 MG/ML IJ SOLN
30.0000 mg | Freq: Four times a day (QID) | INTRAMUSCULAR | Status: AC | PRN
Start: 2015-01-19 — End: 2015-01-20
  Administered 2015-01-19: 30 mg via INTRAVENOUS

## 2015-01-19 MED ORDER — FENTANYL CITRATE (PF) 100 MCG/2ML IJ SOLN
INTRAMUSCULAR | Status: DC | PRN
  Administered 2015-01-19: 20 ug via INTRAVENOUS

## 2015-01-19 MED ORDER — NALBUPHINE HCL 10 MG/ML IJ SOLN
5.0000 mg | INTRAMUSCULAR | Status: DC | PRN
  Administered 2015-01-19: 5 mg via SUBCUTANEOUS

## 2015-01-19 MED ORDER — FENTANYL CITRATE (PF) 100 MCG/2ML IJ SOLN: 25.0000 ug | INTRAMUSCULAR | Status: DC | PRN

## 2015-01-19 MED ORDER — ERYTHROMYCIN 5 MG/GM OP OINT
TOPICAL_OINTMENT | OPHTHALMIC | Status: AC
  Filled 2015-01-19: qty 1

## 2015-01-19 MED ORDER — SIMETHICONE 80 MG PO CHEW
80.0000 mg | CHEWABLE_TABLET | ORAL | Status: DC
  Administered 2015-01-19 – 2015-01-21 (×3): 80 mg via ORAL
  Filled 2015-01-19 (×3): qty 1

## 2015-01-19 MED ORDER — TETANUS-DIPHTH-ACELL PERTUSSIS 5-2.5-18.5 LF-MCG/0.5 IM SUSP: 0.5000 mL | Freq: Once | INTRAMUSCULAR | Status: DC

## 2015-01-19 MED ORDER — MORPHINE SULFATE (PF) 0.5 MG/ML IJ SOLN
INTRAMUSCULAR | Status: DC | PRN
  Administered 2015-01-19: .2 mg via EPIDURAL

## 2015-01-19 MED ORDER — DIPHENHYDRAMINE HCL 50 MG/ML IJ SOLN: 12.5000 mg | INTRAMUSCULAR | Status: DC | PRN

## 2015-01-19 SURGICAL SUPPLY — 43 items
APL SKNCLS STERI-STRIP NONHPOA (GAUZE/BANDAGES/DRESSINGS) ×2
BENZOIN TINCTURE PRP APPL 2/3 (GAUZE/BANDAGES/DRESSINGS) ×2 IMPLANT
BINDER ABDOMINAL 12 ML 46-62 (SOFTGOODS) ×2 IMPLANT
CLAMP CORD UMBIL (MISCELLANEOUS) ×2 IMPLANT
CLIP FILSHIE TUBAL LIGA STRL (Clip) ×2 IMPLANT
CLOSURE WOUND 1/2 X4 (GAUZE/BANDAGES/DRESSINGS) ×3
CLOTH BEACON ORANGE TIMEOUT ST (SAFETY) ×4 IMPLANT
DRAIN JACKSON PRT FLT 7MM (DRAIN) ×4 IMPLANT
DRAPE SHEET LG 3/4 BI-LAMINATE (DRAPES) ×2 IMPLANT
DRSG COVADERM PLUS 2X2 (GAUZE/BANDAGES/DRESSINGS) ×4 IMPLANT
DRSG OPSITE POSTOP 4X10 (GAUZE/BANDAGES/DRESSINGS) ×4 IMPLANT
DRSG OPSITE POSTOP 4X12 (GAUZE/BANDAGES/DRESSINGS) ×2 IMPLANT
DURAPREP 26ML APPLICATOR (WOUND CARE) ×4 IMPLANT
ELECT REM PT RETURN 9FT ADLT (ELECTROSURGICAL) ×4
ELECTRODE REM PT RTRN 9FT ADLT (ELECTROSURGICAL) ×2 IMPLANT
EVACUATOR SILICONE 100CC (DRAIN) ×4 IMPLANT
EXTRACTOR VACUUM M CUP 4 TUBE (SUCTIONS) IMPLANT
EXTRACTOR VACUUM M CUP 4' TUBE (SUCTIONS)
GLOVE BIO SURGEON STRL SZ7 (GLOVE) ×4 IMPLANT
GLOVE BIOGEL PI IND STRL 7.0 (GLOVE) ×2 IMPLANT
GLOVE BIOGEL PI INDICATOR 7.0 (GLOVE) ×2
GOWN STRL REUS W/TWL LRG LVL3 (GOWN DISPOSABLE) ×8 IMPLANT
KIT ABG SYR 3ML LUER SLIP (SYRINGE) IMPLANT
NDL HYPO 25X5/8 SAFETYGLIDE (NEEDLE) ×2 IMPLANT
NEEDLE HYPO 25X5/8 SAFETYGLIDE (NEEDLE) ×4 IMPLANT
NS IRRIG 1000ML POUR BTL (IV SOLUTION) ×4 IMPLANT
PACK C SECTION WH (CUSTOM PROCEDURE TRAY) ×4 IMPLANT
PAD ABD 7.5X8 STRL (GAUZE/BANDAGES/DRESSINGS) ×2 IMPLANT
PAD OB MATERNITY 4.3X12.25 (PERSONAL CARE ITEMS) ×4 IMPLANT
RTRCTR C-SECT PINK 25CM LRG (MISCELLANEOUS) ×4 IMPLANT
SPONGE GAUZE 4X4 12PLY STER LF (GAUZE/BANDAGES/DRESSINGS) ×2 IMPLANT
STRIP CLOSURE SKIN 1/2X4 (GAUZE/BANDAGES/DRESSINGS) ×3 IMPLANT
SUT MNCRL 0 VIOLET CTX 36 (SUTURE) IMPLANT
SUT MONOCRYL 0 CTX 36 (SUTURE) ×4
SUT PLAIN 2 0 XLH (SUTURE) ×8 IMPLANT
SUT SILK 2 0 SH (SUTURE) ×2 IMPLANT
SUT VIC AB 0 CTX 36 (SUTURE) ×24
SUT VIC AB 0 CTX36XBRD ANBCTRL (SUTURE) ×10 IMPLANT
SUT VIC AB 2-0 CT1 27 (SUTURE) ×4
SUT VIC AB 2-0 CT1 TAPERPNT 27 (SUTURE) IMPLANT
SUT VIC AB 4-0 KS 27 (SUTURE) ×6 IMPLANT
TOWEL OR 17X24 6PK STRL BLUE (TOWEL DISPOSABLE) ×4 IMPLANT
TRAY FOLEY CATH SILVER 14FR (SET/KITS/TRAYS/PACK) ×4 IMPLANT

## 2015-01-19 NOTE — Brief Op Note (Signed)
01/19/2015  11:29 AM  PATIENT:  Tara Rocha  [age] y.o. 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 SURGEON:  Surgeon(s) and Role:    * Lesly DukesKelly H Leggett, MD - Primary    * Tilda BurrowJohn Windsor Goeken V, MD - Assisting  PHYSICIAN ASSISTANT:   ASSISTANTS: none   ANESTHESIA:   epidural and spinal  EBL:  Total I/O In: 2500 [I.V.:2500] Out: 950 [Urine:150; Blood:800]  BLOOD ADMINISTERED:none  DRAINS: (2) Jackson-Pratt drain(s) with closed bulb suction in the sUB CUTANEOUS SPACE   LOCAL MEDICATIONS USED:  NONE  SPECIMEN:  No Specimen  DISPOSITION OF SPECIMEN:  N/A  COUNTS:  YES  TOURNIQUET:  * No tourniquets in log *  DICTATION: .Dragon Dictation  PLAN OF CARE: Admit to inpatient   PATIENT DISPOSITION:  PACU - hemodynamically stable.   Delay start of Pharmacological VTE agent (>24hrs) due to surgical blood loss or risk of bleeding: not applicable  Details of procedure: See Dr. Bertram DenverLeggett's operative note for cesarean section details. Addendum: This is for details of the wide excision of Cicatrix . Cesarean section was completed by Dr. Penne LashLeggett as described in her operative note up to and including closure of the fascia. At this time I began wide excision the area cicatrix. The previous lower abdominal transverse incision was extended laterally through the skin with knife dissection to the previously marked corners of the planned skin and fatty tissue removal. Incision length was approximate 45 cm with width of tissue removed approximately 12 cm, with tapering at each end. Bovie cautery set on 5050 was then used on coagulation mode to in, undermining at the interface  between Camper's and Scarpa's fascia, leaving a generous layer of fatty and connective tissue overlying the fascia. Careful attention to hemostasis was maintained with point cautery used as necessary with one area on the right side requiring suture ligature. Wound was irrigated with saline solution and then reapproximation of the deep fatty tissue with a series of horizontal mattress sutures of 20 plain suture, followed by placement of 2 subcutaneous J-P drains which were allowed to exit through stab incisions in the mons pubis. Superficial horizontal mattress sutures were placed as well and then subcuticular 4-0 Vicryl on a Keith needle used to to close the skin incision beginning in each end and resulting in good tissue edge approximation. Steri-Strips and benzoin were applied followed by honeycomb dressing. J-P drains were placed to suction drainage using the "hand grenade" bulbs. Patient to recovery room in good condition with sponge and needle counts correct

## 2015-01-19 NOTE — Anesthesia Postprocedure Evaluation (Signed)
  Anesthesia Post-op Note  Patient: Tara Rocha  Procedure(s) Performed: Procedure(s): REPEAT CESAREAN SECTION (Bilateral) BILATERAL TUBAL LIGATION  Patient Location: PACU  Anesthesia Type:Spinal  Level of Consciousness: awake, alert  and oriented  Airway and Oxygen Therapy: Patient Spontanous Breathing  Post-op Pain: none  Post-op Assessment: Post-op Vital signs reviewed, Patient's Cardiovascular Status Stable, Respiratory Function Stable, Patent Airway and No signs of Nausea or vomiting  Post-op Vital Signs: Reviewed and stable  Last Vitals:  Filed Vitals:   01/19/15 0809  BP: 135/79  Pulse: 92  Temp: 36.6 C  Resp: 16    Complications: No apparent anesthesia complications

## 2015-01-19 NOTE — Anesthesia Procedure Notes (Signed)
Spinal Patient location during procedure: OR Start time: 01/19/2015 9:20 AM End time: 01/19/2015 9:33 AM Staffing Anesthesiologist: Sebastian AcheMANNY, Paz Fuentes Performed by: anesthesiologist  Preanesthetic Checklist Completed: patient identified, site marked, surgical consent, pre-op evaluation, timeout performed, IV checked, risks and benefits discussed and monitors and equipment checked Spinal Block Patient position: sitting Prep: site prepped and draped and DuraPrep Patient monitoring: blood pressure, continuous pulse ox, cardiac monitor and heart rate Approach: midline Location: L3-4 Injection technique: single-shot Needle Needle type: Pencil-Tip  Needle gauge: 24 G Needle length: 5 cm Catheter size: 19 g Catheter at skin depth: 13 cm Assessment Sensory level: T4 Additional Notes CSE placed with 17g touy and 24g spinal.  Spinal meds marcaine with Dex 12mg , fentanyl 20mcg, morphine 200mcg, epidural cath neg asp. Test with 2cc 2% lidocaine

## 2015-01-19 NOTE — Anesthesia Postprocedure Evaluation (Signed)
Anesthesia Post Note  Patient: Tara Rocha  Procedure(s) Performed: Procedure(s) (LRB): REPEAT CESAREAN SECTION (Bilateral) BILATERAL TUBAL LIGATION  Anesthesia type: Spinal  Patient location: Mother/Baby  Post pain: Pain level controlled  Post assessment: Post-op Vital signs reviewed  Last Vitals:  Filed Vitals:   01/19/15 1520  BP: 106/64  Pulse: 57  Temp: 36.7 C  Resp: 14    Post vital signs: Reviewed  Level of consciousness: awake  Complications: No apparent anesthesia complications

## 2015-01-19 NOTE — Addendum Note (Signed)
Addendum  created 01/19/15 1612 by Algis GreenhouseLinda A Jaslen Adcox, CRNA   Modules edited: Notes Section   Notes Section:  File: [id number]

## 2015-01-19 NOTE — Transfer of Care (Signed)
Immediate Anesthesia Transfer of Care Note  Patient: Tara Rocha  Procedure(s) Performed: Procedure(s): REPEAT CESAREAN SECTION (Bilateral) BILATERAL TUBAL LIGATION  Patient Location: PACU  Anesthesia Type:Spinal  Level of Consciousness: awake, alert  and oriented  Airway & Oxygen Therapy: Patient Spontanous Breathing  Post-op Assessment: Report given to RN and Post -op Vital signs reviewed and stable  Post vital signs: Reviewed and stable  Last Vitals:  Filed Vitals:   01/19/15 0809  BP: 135/79  Pulse: 92  Temp: 36.6 C  Resp: 16    Complications: No apparent anesthesia complications

## 2015-01-19 NOTE — Lactation Note (Signed)
This note was copied from the chart of Boy Donnita FallsShawna Montante. Lactation Consultation Note  Patient Name: Boy Donnita FallsShawna Nill JXBJY'NToday's Date: 01/19/2015 Reason for consult: Initial assessment Assisted Mom with positioning and latching baby. Baby sleepy with initial visit but became awake and after few attempts was able to latch using breast compression. Mom's nipples are erect but will flatten with breast compression, demonstrated sandwiching of breast which helped baby to latch. Mom reports with her 2 older children she had difficulty with LMS. Mom requested a DEBP to start post pumping to help with milk supply. LC set this up for Mom and demonstrated cleaning. Advised to use on preemie setting. Encouraged Mom to pump prn for now as this is her 1st day post c/s. Mom reported with her 1st baby she pumped early but did not with her 2nd baby. She feels the pumping brought her milk in better. Advised pumping 4-6 times a day after feeding. Advised Mom to BF this baby with feeding ques, 8-12 times in 24 hours. Lactation brochure left for review, advised of OP services and support group. Encouraged to call for questions/concerns or assist as needed.   Maternal Data Has patient been taught Hand Expression?: Yes Does the patient have breastfeeding experience prior to this delivery?: Yes  Feeding Feeding Type: Breast Fed  LATCH Score/Interventions Latch: Repeated attempts needed to sustain latch, nipple held in mouth throughout feeding, stimulation needed to elicit sucking reflex. Intervention(s): Adjust position;Assist with latch;Breast massage;Breast compression  Audible Swallowing: None  Type of Nipple: Everted at rest and after stimulation (flatten w/breast compression)  Comfort (Breast/Nipple): Soft / non-tender     Hold (Positioning): Assistance needed to correctly position infant at breast and maintain latch.  LATCH Score: 6  Lactation Tools Discussed/Used     Consult Status Consult Status:  Follow-up Date: 01/20/15 Follow-up type: In-patient    Alfred LevinsGranger, Teruko Joswick Ann 01/19/2015, 4:46 PM

## 2015-01-20 ENCOUNTER — Encounter (HOSPITAL_COMMUNITY): Payer: Self-pay | Admitting: Obstetrics & Gynecology

## 2015-01-20 LAB — CBC
HEMATOCRIT: 33.6 % — AB (ref 36.0–46.0)
Hemoglobin: 11.8 g/dL — ABNORMAL LOW (ref 12.0–15.0)
MCH: 31 pg (ref 26.0–34.0)
MCHC: 35.1 g/dL (ref 30.0–36.0)
MCV: 88.2 fL (ref 78.0–100.0)
Platelets: 171 10*3/uL (ref 150–400)
RBC: 3.81 MIL/uL — ABNORMAL LOW (ref 3.87–5.11)
RDW: 11.9 % (ref 11.5–15.5)
WBC: 12.5 10*3/uL — AB (ref 4.0–10.5)

## 2015-01-20 LAB — TYPE AND SCREEN
ABO/RH(D): O POS
Antibody Screen: NEGATIVE
Unit division: 0
Unit division: 0

## 2015-01-20 MED ORDER — HYDROMORPHONE HCL 2 MG PO TABS: 1.0000 mg | ORAL_TABLET | ORAL | Status: DC | PRN

## 2015-01-20 MED ORDER — METOCLOPRAMIDE HCL 10 MG PO TABS
5.0000 mg | ORAL_TABLET | Freq: Three times a day (TID) | ORAL | Status: DC
  Administered 2015-01-20 – 2015-01-22 (×7): 5 mg via ORAL
  Filled 2015-01-20 (×7): qty 1

## 2015-01-20 MED ORDER — HYDROMORPHONE HCL 2 MG PO TABS
2.0000 mg | ORAL_TABLET | ORAL | Status: DC | PRN
  Administered 2015-01-20 – 2015-01-22 (×9): 2 mg via ORAL
  Filled 2015-01-20 (×10): qty 1

## 2015-01-20 NOTE — Progress Notes (Signed)
Post Op Day 1  Subjective: Tara Rocha is a 34 y.o. [id number] 10446w1d s/p repeat C-section, BTL, and wide excision of cicatrix.  No acute events overnight.  Pt denies problems with ambulating, voiding or po intake.  She denies nausea or vomiting.  Pain is moderately controlled with Percocet and Motrin.  She has not had flatus. She has not had a bowel movement.  Lochia Small.  Plan for birth control is bilateral tubal ligation.  Method of Feeding: Breast.  Patient's main complaint with morning was with itching all over her body. Reports it has been happening since she started taking Percocet. Is currently on Benadryl but says it is not lasting long enough.  Objective: Blood pressure 116/62, pulse 65, temperature 98.2 F (36.8 C), temperature source Oral, resp. rate 16, last menstrual period 04/20/2014, SpO2 98 %, unknown if currently breastfeeding.  Physical Exam:  General: alert, cooperative and no distress. Patient actively scratching her arms throughout encounter. Lochia:normal flow Chest: CTAB without wheezing or rales Heart: RRR no m/r/g Abdomen: +BS, soft, abdominal binder in place; drains in place Uterine Fundus: firm DVT Evaluation: No evidence of DVT seen on physical exam. Extremities: 1+ edema bilaterally; Distal pulses 2+ Skin: No visible rashes.  No results for input(s): HGB, HCT in the last 72 hours.  Assessment/Plan:  ASSESSMENT: Tara Rocha is a 34 y.o. [phone] 6246w1d s/p repeat C-section, BTL, and wide excision of cicatrix.  Plan for discharge tomorrow  Considering change of pain medications due to persistent itching with Percocet. Need to limit Benadryl due to its influence on patient's milk supply and her desire to breast feed.   LOS: 1 day   Wandra MannanManning, Brittany, PA-Student 01/20/2015, 7:20 AM

## 2015-01-20 NOTE — Lactation Note (Signed)
This note was copied from the chart of Tara Donnita FallsShawna Swantek. Lactation Consultation Note Mom states BF going well. Baby is sleeping. She is post pumping w/DEBP for breast stimulation, getting a drop of colostrum. States she has to wake him for feedings and stimulate him to eat but hears swallows while BF. Has no questions at this time. Encouraged to hand express after pumping. Patient Name: Tara Donnita FallsShawna Paster ZOXWR'UToday's Date: 01/20/2015 Reason for consult: Follow-up assessment   Maternal Data    Feeding    LATCH Score/Interventions                      Lactation Tools Discussed/Used     Consult Status Consult Status: Follow-up Date: 01/21/15 Follow-up type: In-patient    Kajol Crispen, Diamond NickelLAURA G 01/20/2015, 11:06 AM

## 2015-01-20 NOTE — Progress Notes (Signed)
During shift foley was taking out at 0246am by [employer] [place].  Urine output within normal limits mom voiding adequately.  Notified charge nurse that mom is stating her bladder feels full and that she wants her cathter back in, educated performed as well as bladder scan was preformed at which is measured 0ml.

## 2015-01-20 NOTE — Clinical Social Work Maternal (Signed)
  CLINICAL SOCIAL WORK MATERNAL/CHILD NOTE  Patient Details  Name: Tara Rocha MRN: [id number] Date of Birth: 02-04-1981  Date:  01/20/2015  Clinical Social Worker Initiating Note:  Eduard Clos, [place] Date/ Time Initiated:  01/20/15/1024     Child's Name:  Tara Rocha   Legal Guardian:  Mother   Need for Interpreter:  None   Date of Referral:  01/20/15     Reason for Referral:  Other (Comment) (PPD)   Referral Source:  Central Nursery   Address:  [address], [place] [address]  Phone number:  [phone]   Household Members:  Spouse, Minor Children   Natural Supports (not living in the home):  [place], [place], Extended Family, Artist Supports:     Employment: Full-time   Type of Work:  (FOB- college professor  MOB- Geophysicist/field seismologist)   Education:  Engineer, maintenance Resources:  Multimedia programmer   Other Resources:      Cultural/Religious Considerations Which May Impact Care:  None    Strengths:  Ability to meet basic needs , Pediatrician chosen , Compliance with medical plan , Home prepared for child    Risk Factors/Current Problems:  None   Cognitive State:  Insightful , Alert    Mood/Affect:  Comfortable , Bright    CSW Assessment: CSW met with MOB and FOB at bedside. CSW introduced self and role. MOB reports she and her husband have 2 daughters at home- age 8 and 60. MOB's parents are here visiting for the next 4 weeks from [place] where they are [place]. MOB is relieved to have the additional help and support during this transition.  CSW discussed MOB's history of PPD- per her report, she was overly tearful, withdrawn and had "more than the baby blues" after the birth of her 2nd daughter 32 years ago. MOB reports taking PAXIL for about 3 weeks post partum and found that it was helpful.  CSW educated both MOB and FOB of her high risk for re-occuring PPD and have encouraged them to watch for signs/symptoms and  possible need for treatment/care again. Both parents are insightful and indicate they feel equipped to watch for this as they transition home.  Support and encouragement provided- both parents appreciative of CSW visit.   CSW Plan/Description:  No Further Intervention Required/No Barriers to Discharge    Ludwig Clarks, LCSW 01/20/2015, 10:28 AM

## 2015-01-21 NOTE — Lactation Note (Signed)
This note was copied from the chart of Tara Rocha Charrier. Lactation Consultation Note: Mother states that infant is feeding better. She states she is hearing infant swallow. Mother is post pumping and is seeing small amts of colostrum. Mother denies having any discomforts with feeding. Mother has two daughters visiting at present. Mother to page for assistance if needed.   Patient Name: Tara Rocha [place] ZOXWR'UToday's Date: 01/21/2015 Reason for consult: Follow-up assessment   Maternal Data    Feeding    LATCH Score/Interventions                      Lactation Tools Discussed/Used     Consult Status Consult Status: Follow-up    Stevan BornKendrick, Sevana Grandinetti [facility] 01/21/2015, 3:32 PM

## 2015-01-21 NOTE — Progress Notes (Signed)
Subjective: Postpartum Day 2: Cesarean Delivery Patient reports incisional pain, tolerating PO, + flatus and no problems voiding.    Objective: Vital signs in last 24 hours: Temp:  [97.5 F (36.4 C)-98.2 F (36.8 C)] 98 F (36.7 C) (04/27 0606) Pulse Rate:  [64-68] 64 (04/27 0606) Resp:  [18-20] 20 (04/27 0606) BP: (110-136)/(55-75) 136/75 mmHg (04/27 0606)  Physical Exam:  General: alert, cooperative and no distress Lochia: appropriate Uterine Fundus: firm Incision: healing well, no significant drainage, JP drain 20ml DVT Evaluation: No evidence of DVT seen on physical exam., compression hose on   Recent Labs  01/20/15 0640  HGB 11.8*  HCT 33.6*    Assessment/Plan: Status post Cesarean section. Doing well postoperatively.  Continue care Plan discharge home tomorrow, has appt with Dr Emelda FearFerguson for close followup  Circ at Dr Rayna SextonFerguson's office.  [facility] StrattonWILLIAMS,Tara Rocha, 7:33 AM

## 2015-01-22 MED ORDER — HYDROMORPHONE HCL 2 MG PO TABS: 1.0000 mg | ORAL_TABLET | ORAL | Status: DC | PRN

## 2015-01-22 MED ORDER — METOCLOPRAMIDE HCL 5 MG PO TABS: 5.0000 mg | ORAL_TABLET | Freq: Three times a day (TID) | ORAL | Status: DC

## 2015-01-22 MED ORDER — ONDANSETRON 4 MG PO TBDP
4.0000 mg | ORAL_TABLET | Freq: Three times a day (TID) | ORAL | Status: DC | PRN
  Administered 2015-01-22: 4 mg via ORAL
  Filled 2015-01-22 (×2): qty 1

## 2015-01-22 MED ORDER — IBUPROFEN 600 MG PO TABS: 600.0000 mg | ORAL_TABLET | Freq: Four times a day (QID) | ORAL | Status: DC

## 2015-01-22 NOTE — Discharge Instructions (Signed)

## 2015-01-22 NOTE — Discharge Summary (Signed)
Obstetric Discharge Summary Reason for Admission: cesarean section Prenatal Procedures: ultrasound Intrapartum Procedures: cesarean: low cervical, transverse, tubal ligation and wide excision of scar (cicatrix). 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 SURGEON: Surgeon(s) and Role:  * Lesly Dukes, MD - Primary  * Tilda Burrow, MD - Assisting  PHYSICIAN ASSISTANT:   ASSISTANTS: none   ANESTHESIA: epidural and spinal  EBL: Total I/O In: 2500 [I.V.:2500] Out: 950 [Urine:150; Blood:800]  BLOOD ADMINISTERED:none  DRAINS: (2) Jackson-Pratt drain(s) with closed bulb suction in the sUB CUTANEOUS SPACE   LOCAL MEDICATIONS USED: NONE  SPECIMEN: No Specimen  DISPOSITION OF SPECIMEN: N/A  COUNTS: YES  TOURNIQUET: * No tourniquets in log *  DICTATION: .Dragon Dictation  PLAN OF CARE: Admit to inpatient   PATIENT DISPOSITION: PACU - hemodynamically stable.  Delay start of Pharmacological VTE agent (>24hrs) due to surgical blood loss or risk of bleeding: not applicable  Details of procedure: See Dr. Bertram Denver operative note for cesarean section details. Addendum: This is for details of the wide excision of Cicatrix . Cesarean section was completed by Dr. Penne Lash as described in her operative note up to and including closure of the fascia. At this time I began wide excision the area cicatrix. The previous lower abdominal transverse incision was extended laterally through the skin with knife dissection to the previously marked corners of the planned skin and  fatty tissue removal. Incision length was approximate 45 cm with width of tissue removed approximately 12 cm, with tapering at each end. Bovie cautery set on 5050 was then used on coagulation mode to in, undermining at the interface between Camper's and Scarpa's fascia, leaving a generous layer of fatty and connective tissue overlying the fascia. Careful attention to hemostasis was maintained with point cautery used as necessary with one area on the right side requiring suture ligature. Wound was irrigated with saline solution and then reapproximation of the deep fatty tissue with a series of horizontal mattress sutures of 20 plain suture, followed by placement of 2 subcutaneous J-P drains which were allowed to exit through stab incisions in the mons pubis. Superficial horizontal mattress sutures were placed as well and then subcuticular 4-0 Vicryl on a Keith needle used to to close the skin incision beginning in each end and resulting in good tissue edge approximation. Steri-Strips and benzoin were applied followed by honeycomb dressing. J-P drains were placed to suction drainage using the "hand grenade" bulbs. Patient to recovery room in good condition with sponge and needle counts correct  Tilda Burrow, MD (Physician)  Hospital Course:  Active Problems:   Status post repeat low transverse cesarean section   Tara Rocha is a 34 y.o. 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  Patient was admitted 01/19/2015.  She has postpartum course that was uncomplicated including no problems with ambulating, PO intake, urination, pain, or bleeding. The pt feels ready to go home and  will be discharged with outpatient follow-up.   Today: No acute events overnight.  Pt denies problems with ambulating, voiding or po intake.  She denies nausea or vomiting.  Pain is moderately controlled with Dilaudid and Motrin.  She has had flatus. She has not had a  bowel movement.  Lochia Minimal.  Plan for  birth control is  bilateral tubal ligation.  Method of Feeding: Breast  Physical Exam:  Today's Vitals   01/22/15 0354 01/22/15 0450 01/22/15 0619 01/22/15 0622  BP:   116/67   Pulse:   66   Temp:   97.9 F (36.6 C)   TempSrc:   Oral   Resp:   18   SpO2:      PainSc: 5  3   5     General: alert, cooperative and no distress  Cardiovascular: regular rate and rhythm without murmurs. Abdomen: bowel sounds present; tenderness to palpation along incisional line.  Lochia: appropriate Uterine Fundus: firm and midline Incision: healing well, drain in place DVT Evaluation: No evidence of DVT seen on physical exam. Distal pulses 2+ bilaterally.  H/H: Lab Results  Component Value Date/Time   HGB 11.8* 01/20/2015 06:40 AM   HCT 33.6* 01/20/2015 06:40 AM    Discharge Diagnoses: Term Pregnancy-delivered  Discharge Information: Date: 01/22/2015 Activity: pelvic rest Diet: routine  Medications: Ibuprofen and Reglan and Dilaudid. Breast feeding:  Yes Condition: stable Instructions: refer to handout Discharge to: home    Medication List    STOP taking these medications        accu-chek multiclix lancets     acetaminophen 500 MG tablet  Commonly known as:  TYLENOL     DEEP BLUE RELIEF Gel     diphenhydramine-acetaminophen 25-500 MG Tabs  Commonly known as:  TYLENOL PM     doxylamine (Sleep) 25 MG tablet  Commonly known as:  UNISOM     glucose blood test strip     MAGNESIUM CITRATE PO     Medical Compression Thigh High Misc     OVER THE COUNTER MEDICATION     pyridoxine 100 MG tablet  Commonly known as:  B-6     zinc gluconate 50 MG tablet      TAKE these medications        HYDROmorphone 2 MG tablet  Commonly known as:  DILAUDID  Take 0.5 tablets (1 mg total) by mouth every 4 (four) hours as needed for severe pain.     ibuprofen 600 MG tablet  Commonly known as:  ADVIL,MOTRIN  Take 1 tablet (600 mg total) by mouth every 6 (six) hours.     metoCLOPramide 5  MG tablet  Commonly known as:  REGLAN  Take 1 tablet (5 mg total) by mouth 3 (three) times daily before meals.     prenatal multivitamin Tabs tablet  Take 1 tablet by mouth daily at 12 noon.             Follow-up Information    Follow up with [facility] On 01/27/2015.   Why:  for wound follow up with Dr. Kristen CardinalFerguson   Contact information:   [address] [place]  [phone]      NewsomsBrittany Manning, Wisconsintudent-PA 01/22/2015,7:23 AM   CNM attestation I have seen and examined this patient and agree with above documentation in the medical student's note.   Carlyle DollyShawna L Eberlein is a 34 y.o. [phone] s/p scheduled rLTCS, BTL, and scar excision.   Pain is well controlled.  Plan for birth control is bilateral tubal ligation.  Method of Feeding: breast  PE:  BP 125/62 mmHg  Pulse 74  Temp(Src) 97.9 F (36.6 C) (Oral)  Resp 16  SpO2 98%  LMP 04/20/2014  Breastfeeding? Unknown Heart: RRR Lungs: nl effort Fundus firm  Incision: honeycomb intact, clean and dry; JP drains x 2 w/ sm serosang drainage Ext: 1+ edema, no evidence of DVTs   Recent Labs  01/20/15 0640  HGB 11.8*  HCT 33.6*     Plan: discharge today - postpartum care discussed - f/u at [facility] as scheduled for incision check and circ for infant, then in 6wks for postpartum visit   SHAW, KIMBERLY, CNM 10:18 AM

## 2015-01-22 NOTE — Progress Notes (Signed)
Pt showered and removed pressure dressing- honeycomb falling off so removed. Small amt of serosanginous drainage from right mid incision. Steri strip applied and new honeycomb (2) applied. JP drains intact.

## 2015-01-25 ENCOUNTER — Encounter (HOSPITAL_COMMUNITY): Payer: Self-pay

## 2015-01-25 ENCOUNTER — Inpatient Hospital Stay (HOSPITAL_COMMUNITY)
Admission: AD | Admit: 2015-01-25 | Discharge: 2015-01-25 | Disposition: A | Discharge status: Home / Self Care | Payer: BLUE CROSS/BLUE SHIELD | Source: Ambulatory Visit | Attending: Family Medicine | Admitting: Family Medicine

## 2015-01-25 DIAGNOSIS — O9089 Other complications of the puerperium, not elsewhere classified: Secondary | ICD-10-CM | POA: Diagnosis present

## 2015-01-25 DIAGNOSIS — G8918 Other acute postprocedural pain: Secondary | ICD-10-CM | POA: Diagnosis not present

## 2015-01-25 DIAGNOSIS — R51 Headache: Secondary | ICD-10-CM | POA: Insufficient documentation

## 2015-01-25 DIAGNOSIS — M545 Low back pain: Secondary | ICD-10-CM | POA: Insufficient documentation

## 2015-01-25 LAB — CBC
HEMATOCRIT: 33.3 % — AB (ref 36.0–46.0)
HEMOGLOBIN: 11.4 g/dL — AB (ref 12.0–15.0)
MCH: 30.5 pg (ref 26.0–34.0)
MCHC: 34.2 g/dL (ref 30.0–36.0)
MCV: 89 fL (ref 78.0–100.0)
Platelets: 209 10*3/uL (ref 150–400)
RBC: 3.74 MIL/uL — ABNORMAL LOW (ref 3.87–5.11)
RDW: 12.3 % (ref 11.5–15.5)
WBC: 8.5 10*3/uL (ref 4.0–10.5)

## 2015-01-25 LAB — URINALYSIS, ROUTINE W REFLEX MICROSCOPIC
Bilirubin Urine: NEGATIVE
GLUCOSE, UA: NEGATIVE mg/dL
Ketones, ur: NEGATIVE mg/dL
Leukocytes, UA: NEGATIVE
Nitrite: NEGATIVE
PH: 6 (ref 5.0–8.0)
Protein, ur: NEGATIVE mg/dL
SPECIFIC GRAVITY, URINE: 1.01 (ref 1.005–1.030)
Urobilinogen, UA: 0.2 mg/dL (ref 0.0–1.0)

## 2015-01-25 LAB — URINE MICROSCOPIC-ADD ON: RBC / HPF: NONE SEEN RBC/hpf (ref <3)

## 2015-01-25 MED ORDER — BUPIVACAINE LIPOSOME 1.3 % IJ SUSP
20.0000 mL | Freq: Once | INTRAMUSCULAR | Status: AC
  Administered 2015-01-25: 266 mg
  Filled 2015-01-25: qty 20

## 2015-01-25 NOTE — MAU Note (Signed)
Had C/S on 4/25.  Has 2 drains and they are causing pain where they are inserted.  Low back pain. Off and on severe headache in last 24 hours.

## 2015-01-25 NOTE — MAU Provider Note (Signed)
History     CSN: [id number]  Arrival date and time: 01/25/15 0556   None     Chief Complaint  Patient presents with  . Post-op Problem   HPI  Patient here for evaluation of postoperative pain.  Underwent repeat cesarean section with extensive scar revision on 4/25.  Seen in MAU on POD#6.  Reports pain overall has been well controlled except for pain at site of drains where she is exquisitely tender.  Still having drainage from drains, they are to be removed in clinic on 5/4. - no fevers/sweats, no nausea or vomiting  Past Medical History  Diagnosis Date  . Poor circulation   . History of staph infection 08-2011    Required surgical excision (Left thigh)  . Varicose veins   . Leg pain   . Hypoglycemia   . 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 Headache     migraines    Past Surgical History  Procedure Laterality Date  . Tonsillectomy      age 89  . Cesarean section  2008, 2012  . Leg skin lesion  biopsy / excision  08-2011    Left upper thigh skin excision for Staph Infection  . Endovenous ablation saphenous vein w/ laser Left 10-03-2013    left greater saphenous vein and stab phlebectomies > 20 incisions left leg by Gretta Beganodd Early MD   . Endovenous ablation saphenous vein w/ laser Right 10-17-2013    endovenous laser ablation with stab phlebectomy 10-20 incisions right leg  . Cesarean section with bilateral tubal ligation Bilateral 01/19/2015    Procedure: REPEAT CESAREAN SECTION;  Surgeon: Lesly DukesKelly H Leggett, MD;  Location: [facility];  Service: Obstetrics;  Laterality: Bilateral;  . Tubal ligation  01/19/2015    Procedure: BILATERAL TUBAL LIGATION;  Surgeon: Lesly DukesKelly H Leggett, MD;  Location: [facility];  Service: Obstetrics;;    Family History  Problem Relation Age of Onset  . Anemia Mother   . Other Mother     varicose veins  . Cancer Paternal Grandmother     liver  . Thyroid disease Maternal Grandmother   . Other Sister     varicose veins  . Hypertension Father      History  Substance Use Topics  . Smoking status: Never Smoker   . Smokeless tobacco: Never Used  . Alcohol Use: No    Allergies:  Allergies  Allergen Reactions  . Bactrim [Sulfamethoxazole-Trimethoprim] Nausea And Vomiting    Prescriptions prior to admission  Medication Sig Dispense Refill Last Dose  . HYDROmorphone (DILAUDID) 2 MG tablet Take 0.5 tablets (1 mg total) by mouth every 4 (four) hours as needed for severe pain. 30 tablet 0 01/25/2015 at 0500  . ibuprofen (ADVIL,MOTRIN) 600 MG tablet Take 1 tablet (600 mg total) by mouth every 6 (six) hours. 50 tablet 1 01/25/2015 at 0500  . metoCLOPramide (REGLAN) 5 MG tablet Take 1 tablet (5 mg total) by mouth 3 (three) times daily before meals. 60 tablet 1 01/24/2015 at Unknown time  . OVER THE COUNTER MEDICATION GAS X   01/25/2015 at Unknown time  . Prenatal Vit-Fe Fumarate-FA (PRENATAL MULTIVITAMIN) TABS tablet Take 1 tablet by mouth daily at 12 noon.   01/24/2015 at Unknown time    Review of Systems  Constitutional: Negative for fever and chills.  HENT: Negative for congestion.   Respiratory: Negative for cough and shortness of breath.   Cardiovascular: Negative for chest pain and leg swelling.  Gastrointestinal: Positive for abdominal  pain. Negative for heartburn, nausea, vomiting and diarrhea.  Genitourinary: Negative for dysuria, urgency, frequency and hematuria.  Skin: Negative for itching and rash.  Neurological: Negative for dizziness, loss of consciousness and headaches.   Physical Exam   Blood pressure 123/67, pulse 79, temperature 98.1 F (36.7 C), temperature source Oral, resp. rate 16, SpO2 98 %, unknown if currently breastfeeding.  Physical Exam  Constitutional: She is oriented to person, place, and time. She appears well-developed and well-nourished.  HENT:  Head: Normocephalic and atraumatic.  Eyes: Conjunctivae and EOM are normal.  Neck: Normal range of motion.  Cardiovascular: Normal rate.   Respiratory:  Effort normal. No respiratory distress.  GI: Soft. Bowel sounds are normal. She exhibits no distension. There is no tenderness. There is no rebound and no guarding.  Abdominal exam appropriate, no signs of infection  Musculoskeletal: Normal range of motion. She exhibits no edema.  Neurological: She is alert and oriented to person, place, and time.  Skin: Skin is warm and dry. No erythema.    MAU Course  Procedures  MDM   Assessment and Plan  34 yo POD#6 here for postoperative pain at insertion site of drains - no signs of wound infection on exam, CBC normal - 20mL exparel 1.3% injection at insertion site of drains with improvement in pain - keep f/u 5/4 with Dr. Emelda Fear for removal of drains  Tara Rocha Tara Rocha 01/25/2015, 7:27 AM

## 2015-01-25 NOTE — Discharge Instructions (Signed)
Tagamet miralax

## 2015-01-26 NOTE — Op Note (Signed)
Tara Rocha PROCEDURE DATE: 01/19/2015  PREOPERATIVE DIAGNOSIS: Intrauterine pregnancy at  7453w1d weeks gestation  POSTOPERATIVE DIAGNOSIS: The same  PROCEDURE:    Low Transverse Cesarean Section, BTL, excision of cicatrix  SURGEON:  Dr. Elsie LincolnKelly Geonna Lockyer  ASSISTANT: Dr. Christin BachJohn Ferguson  INDICATIONS: Tara Rocha is a 34 y.o. [phone] at 6753w1d for repeat cesarean section, tubal ligation, and excision of cicatrix.  The risks of cesarean section discussed with the patient included but were not limited to: bleeding which may require transfusion or reoperation; infection which may require antibiotics; injury to bowel, bladder, ureters or other surrounding organs; injury to the fetus; need for additional procedures including hysterectomy in the event of a life-threatening hemorrhage; placental abnormalities wth subsequent pregnancies, incisional problems, thromboembolic phenomenon and other postoperative/anesthesia complications. The patient concurred with the proposed plan, giving informed written consent for the procedure.    FINDINGS:  Viable infant in cephalic presentation, 9,9 Apgars, weight to be determined in 1 hour, clear amniotic fluid.  Intact placenta, three vessel cord.  Grossly normal uterus, ovaries and fallopian tubes. .   ANESTHESIA:    CSE  ESTIMATED BLOOD LOSS: 800cc  SPECIMENS: Placenta sent to L&D  COMPLICATIONS: None immediate  PROCEDURE IN DETAIL:  The patient received intravenous antibiotics and had sequential compression devices applied to her lower extremities.  CSE anesthesia was dosed up to surgical level) and was found to be adequate. She was then placed in a dorsal supine position with a leftward tilt, and prepped and draped in a sterile manner.  A foley catheter was placed into her bladder and attached to constant gravity.  After an adequate timeout was performed, a Pfannenstiel skin incision was made with scalpel and carried through to the underlying layer of fascia.  The fascia was incised in the midline and this incision was extended bilaterally using the Mayo scissors. Kocher clamps were applied to the superior aspect of the fascial incision and the underlying rectus muscles were dissected off bluntly. A similar process was carried out on the inferior aspect of the facial incision. The rectus muscles were separated in the midline bluntly and the peritoneum was entered bluntly.   A transverse hysterotomy was made with a scalpel and extended bilaterally bluntly. The bladder blade was then removed. The infant was successfully delivered, and cord was clamped and cut and infant was handed over to awaiting neonatology team. Uterine massage was then administered and the placenta delivered intact with three-vessel cord. The uterus was cleared of clot and debris.  The hysterotomy was closed with 0 vicryl and 0 Monocryl.  Tubal ligation was then performed by following each fallopian tube to its fimbriated end and placing a Filshie clip transversely across the entirely of each fallopian tube.  The fascia was closed with 0-Vicryl in a running fashion with good restoration of anatomy.  The peritoneum was closed with 2-0 Vicryl.  The subcutaneus tissue was copiously irrigated.  The skin was closed with 4-0 Vicryl in a subcuticular fashion.  At this paint Dr. Emelda FearFerguson took over the excision of cicatrix.  Please see his op note for details.  All counts were accurate x3. Patient stable to recovery room.

## 2015-01-27 ENCOUNTER — Ambulatory Visit (INDEPENDENT_AMBULATORY_CARE_PROVIDER_SITE_OTHER): Payer: BLUE CROSS/BLUE SHIELD | Admitting: Obstetrics and Gynecology

## 2015-01-27 ENCOUNTER — Encounter: Payer: Self-pay | Admitting: Obstetrics and Gynecology

## 2015-01-27 VITALS — BP 120/70 | Ht 65.0 in | Wt 210.0 lb

## 2015-01-27 DIAGNOSIS — Z9889 Other specified postprocedural states: Secondary | ICD-10-CM

## 2015-01-27 DIAGNOSIS — IMO0001 Reserved for inherently not codable concepts without codable children: Secondary | ICD-10-CM

## 2015-01-27 DIAGNOSIS — T814XXA Infection following a procedure, initial encounter: Principal | ICD-10-CM

## 2015-01-27 MED ORDER — HYDROMORPHONE HCL 2 MG PO TABS: 1.0000 mg | ORAL_TABLET | ORAL | Status: DC | PRN

## 2015-01-27 MED ORDER — CLINDAMYCIN HCL 300 MG PO CAPS: 300.0000 mg | ORAL_CAPSULE | Freq: Three times a day (TID) | ORAL | Status: DC

## 2015-01-27 MED ORDER — METRONIDAZOLE 500 MG PO TABS: 500.0000 mg | ORAL_TABLET | Freq: Two times a day (BID) | ORAL | Status: DC

## 2015-01-27 MED ORDER — AMOXICILLIN-POT CLAVULANATE 875-125 MG PO TABS: 1.0000 | ORAL_TABLET | Freq: Two times a day (BID) | ORAL | Status: DC

## 2015-01-27 NOTE — Progress Notes (Signed)
Patient ID: Tara Rocha, female   DOB: 01-Sep-1981, 34 y.o.   MRN: [id number] Pt here today for post visit. Pt states that she had a c-section on 01/19/15. Pt states that shePatient was told that it is normal to have menstrual bleeding after an endometrial ablation, only about 40% of patients become amenorrheic, 50% of patients have normal or light periods, and 10% of patients have no change in their bleeding pattern and may need further intervention.  She was told to observe her periods for a few more more months to see what her periods will be like after the ablation; it is recommended to wait until at least three months after the procedure before making conclusions about how periods are going to be like after an ablation. [facility]. Pt states that she has noticed today that there is an odor.  Subjective:     Tara Rocha is a [age] y.o. female who presents to the clinic 32 weeks status post cesarean and panniculectoym for abd laxity, cesarean section Diet:       regular without difficulty. Bowel function is: normal. Pain:     Pain is not well controlled.  Medications being used: narcotic analgesics including dilaudid . she is on one tablet  At each dose instead of half a tablet.  The following portions of the patient's history were reviewed and updated as appropriate: allergies, current medications, past family history, past medical history, past social history, past surgical history and problem list.  Review of Systems   pt has not taken her temp , but thinks she may be running temp last nite.   Objective:    BP 120/70 mmHg  Ht 5\' 5"  (1.651 m)  Wt 210 lb (95.255 kg)  BMI 34.95 kg/m2  Breastfeeding? Yes General:  alert and uncomfortable  Abdomen: soft, no hernias  Incision:   well approximated, but marked erythema and Malodor emanating from drain sites. Palpation of incision once drain removed, no purulence. Wound cultured , no dehiscence, incision  well approximated       Pelvic: not done    Assessment:    Postoperative course complicated by wound cellulitis around JP drain exit sites.  Operative findings again reviewed. Pathology report discussed.    Plan:    1. Continue any current medications. 2. Wound care discussed.  3. Activity restrictions: . 4. Anticipated return to work: 4 weeks. 5. Follow up: 3 days for  Recheck Rx Augmentin , Flagyl .

## 2015-01-28 ENCOUNTER — Encounter (HOSPITAL_COMMUNITY): Payer: Self-pay | Admitting: *Deleted

## 2015-01-28 ENCOUNTER — Inpatient Hospital Stay (HOSPITAL_COMMUNITY)
Admission: AD | Admit: 2015-01-28 | Discharge: 2015-02-02 | DRG: 769 | Disposition: A | Discharge status: Home Health | Payer: BLUE CROSS/BLUE SHIELD | Source: Ambulatory Visit | Attending: Obstetrics and Gynecology | Admitting: Obstetrics and Gynecology

## 2015-01-28 ENCOUNTER — Inpatient Hospital Stay (HOSPITAL_COMMUNITY): Payer: BLUE CROSS/BLUE SHIELD

## 2015-01-28 ENCOUNTER — Other Ambulatory Visit: Payer: Self-pay | Admitting: Obstetrics and Gynecology

## 2015-01-28 ENCOUNTER — Telehealth: Payer: Self-pay | Admitting: Obstetrics and Gynecology

## 2015-01-28 DIAGNOSIS — T814XXA Infection following a procedure, initial encounter: Secondary | ICD-10-CM

## 2015-01-28 DIAGNOSIS — L03311 Cellulitis of abdominal wall: Secondary | ICD-10-CM | POA: Diagnosis present

## 2015-01-28 DIAGNOSIS — Z6834 Body mass index (BMI) 34.0-34.9, adult: Secondary | ICD-10-CM

## 2015-01-28 DIAGNOSIS — T8579XA Infection and inflammatory reaction due to other internal prosthetic devices, implants and grafts, initial encounter: Secondary | ICD-10-CM | POA: Diagnosis present

## 2015-01-28 DIAGNOSIS — Y838 Other surgical procedures as the cause of abnormal reaction of the patient, or of later complication, without mention of misadventure at the time of the procedure: Secondary | ICD-10-CM | POA: Diagnosis present

## 2015-01-28 DIAGNOSIS — O99215 Obesity complicating the puerperium: Secondary | ICD-10-CM | POA: Diagnosis present

## 2015-01-28 DIAGNOSIS — L089 Local infection of the skin and subcutaneous tissue, unspecified: Secondary | ICD-10-CM

## 2015-01-28 DIAGNOSIS — L02215 Cutaneous abscess of perineum: Secondary | ICD-10-CM | POA: Diagnosis present

## 2015-01-28 DIAGNOSIS — T8149XA Infection following a procedure, other surgical site, initial encounter: Secondary | ICD-10-CM | POA: Diagnosis present

## 2015-01-28 DIAGNOSIS — E669 Obesity, unspecified: Secondary | ICD-10-CM | POA: Diagnosis present

## 2015-01-28 DIAGNOSIS — T148XXA Other injury of unspecified body region, initial encounter: Secondary | ICD-10-CM

## 2015-01-28 DIAGNOSIS — IMO0001 Reserved for inherently not codable concepts without codable children: Secondary | ICD-10-CM

## 2015-01-28 DIAGNOSIS — L0291 Cutaneous abscess, unspecified: Secondary | ICD-10-CM

## 2015-01-28 DIAGNOSIS — L0390 Cellulitis, unspecified: Secondary | ICD-10-CM

## 2015-01-28 DIAGNOSIS — O860 Infection of obstetric surgical wound: Principal | ICD-10-CM | POA: Diagnosis present

## 2015-01-28 LAB — URINE MICROSCOPIC-ADD ON

## 2015-01-28 LAB — URINALYSIS, ROUTINE W REFLEX MICROSCOPIC
BILIRUBIN URINE: NEGATIVE
Glucose, UA: NEGATIVE mg/dL
KETONES UR: 15 mg/dL — AB
NITRITE: NEGATIVE
Protein, ur: NEGATIVE mg/dL
Specific Gravity, Urine: 1.01 (ref 1.005–1.030)
Urobilinogen, UA: 0.2 mg/dL (ref 0.0–1.0)
pH: 6 (ref 5.0–8.0)

## 2015-01-28 LAB — CBC WITH DIFFERENTIAL/PLATELET
Basophils Absolute: 0 10*3/uL (ref 0.0–0.1)
Basophils Relative: 0 % (ref 0–1)
EOS ABS: 0.1 10*3/uL (ref 0.0–0.7)
Eosinophils Relative: 1 % (ref 0–5)
HCT: 33.7 % — ABNORMAL LOW (ref 36.0–46.0)
Hemoglobin: 11.6 g/dL — ABNORMAL LOW (ref 12.0–15.0)
Lymphocytes Relative: 5 % — ABNORMAL LOW (ref 12–46)
Lymphs Abs: 0.9 10*3/uL (ref 0.7–4.0)
MCH: 30.5 pg (ref 26.0–34.0)
MCHC: 34.4 g/dL (ref 30.0–36.0)
MCV: 88.7 fL (ref 78.0–100.0)
MONOS PCT: 7 % (ref 3–12)
Monocytes Absolute: 1.2 10*3/uL — ABNORMAL HIGH (ref 0.1–1.0)
Neutro Abs: 14.8 10*3/uL — ABNORMAL HIGH (ref 1.7–7.7)
Neutrophils Relative %: 87 % — ABNORMAL HIGH (ref 43–77)
Platelets: 238 10*3/uL (ref 150–400)
RBC: 3.8 MIL/uL — AB (ref 3.87–5.11)
RDW: 12.3 % (ref 11.5–15.5)
WBC: 17.1 10*3/uL — ABNORMAL HIGH (ref 4.0–10.5)

## 2015-01-28 LAB — COMPREHENSIVE METABOLIC PANEL
ALK PHOS: 87 U/L (ref 38–126)
ALT: 26 U/L (ref 14–54)
AST: 24 U/L (ref 15–41)
Albumin: 2.6 g/dL — ABNORMAL LOW (ref 3.5–5.0)
Anion gap: 8 (ref 5–15)
BUN: 13 mg/dL (ref 6–20)
CO2: 27 mmol/L (ref 22–32)
Calcium: 8.1 mg/dL — ABNORMAL LOW (ref 8.9–10.3)
Chloride: 100 mmol/L — ABNORMAL LOW (ref 101–111)
Creatinine, Ser: 0.74 mg/dL (ref 0.44–1.00)
Glucose, Bld: 86 mg/dL (ref 70–99)
POTASSIUM: 3.6 mmol/L (ref 3.5–5.1)
SODIUM: 135 mmol/L (ref 135–145)
TOTAL PROTEIN: 6.1 g/dL — AB (ref 6.5–8.1)
Total Bilirubin: 0.5 mg/dL (ref 0.3–1.2)

## 2015-01-28 LAB — SEDIMENTATION RATE: SED RATE: 82 mm/h — AB (ref 0–22)

## 2015-01-28 MED ORDER — IBUPROFEN 600 MG PO TABS
600.0000 mg | ORAL_TABLET | Freq: Four times a day (QID) | ORAL | Status: DC | PRN
  Administered 2015-01-28 – 2015-02-02 (×13): 600 mg via ORAL
  Filled 2015-01-28 (×14): qty 1

## 2015-01-28 MED ORDER — HYDROMORPHONE HCL 1 MG/ML IJ SOLN
1.0000 mg | INTRAMUSCULAR | Status: DC | PRN
  Administered 2015-02-01 – 2015-02-02 (×2): 1 mg via INTRAVENOUS
  Filled 2015-01-28 (×2): qty 1

## 2015-01-28 MED ORDER — ENOXAPARIN SODIUM 40 MG/0.4ML ~~LOC~~ SOLN
40.0000 mg | SUBCUTANEOUS | Status: DC
  Administered 2015-01-28 – 2015-01-30 (×3): 40 mg via SUBCUTANEOUS
  Filled 2015-01-28 (×4): qty 0.4

## 2015-01-28 MED ORDER — IOHEXOL 300 MG/ML  SOLN
100.0000 mL | Freq: Once | INTRAMUSCULAR | Status: AC | PRN
  Administered 2015-01-28: 100 mL via INTRAVENOUS

## 2015-01-28 MED ORDER — SODIUM CHLORIDE 0.9 % IV SOLN
INTRAVENOUS | Status: DC
  Administered 2015-01-28 – 2015-01-30 (×2): via INTRAVENOUS

## 2015-01-28 MED ORDER — PRENATAL MULTIVITAMIN CH
1.0000 | ORAL_TABLET | Freq: Every day | ORAL | Status: DC
  Administered 2015-01-30 – 2015-02-02 (×3): 1 via ORAL
  Filled 2015-01-28 (×4): qty 1

## 2015-01-28 MED ORDER — ONDANSETRON HCL 4 MG PO TABS: 4.0000 mg | ORAL_TABLET | Freq: Four times a day (QID) | ORAL | Status: DC | PRN

## 2015-01-28 MED ORDER — HYDROMORPHONE HCL 2 MG PO TABS
2.0000 mg | ORAL_TABLET | ORAL | Status: DC | PRN
  Administered 2015-01-28 – 2015-02-02 (×23): 2 mg via ORAL
  Filled 2015-01-28 (×24): qty 1

## 2015-01-28 MED ORDER — ONDANSETRON HCL 4 MG/2ML IJ SOLN: 4.0000 mg | Freq: Four times a day (QID) | INTRAMUSCULAR | Status: DC | PRN

## 2015-01-28 MED ORDER — PIPERACILLIN-TAZOBACTAM 3.375 G IVPB
3.3750 g | Freq: Three times a day (TID) | INTRAVENOUS | Status: DC
  Administered 2015-01-28 – 2015-02-02 (×15): 3.375 g via INTRAVENOUS
  Filled 2015-01-28 (×17): qty 50

## 2015-01-28 MED ORDER — DOCUSATE SODIUM 100 MG PO CAPS
100.0000 mg | ORAL_CAPSULE | Freq: Two times a day (BID) | ORAL | Status: DC
  Administered 2015-01-28 – 2015-02-02 (×7): 100 mg via ORAL
  Filled 2015-01-28 (×8): qty 1

## 2015-01-28 MED ORDER — IOHEXOL 300 MG/ML  SOLN
50.0000 mL | Freq: Once | INTRAMUSCULAR | Status: AC | PRN
  Administered 2015-01-28: 50 mL via ORAL

## 2015-01-28 NOTE — H&P (Signed)
Tara Rocha is an 34 y.o. female. She is readmitted to the hospital now 9 days status post cesarean section, tubal ligation and panniculectomy with placement of subcutaneous drains. I saw her in the office yesterday her regular scheduled office visit postoperative. She reported 48 hours of lower abdominal pain around the JP drain exit sites, and 24 hours of malodor. J-P drain volume had been minimal postop. She denied fever until the last 24 hours. When seen yesterday in the office she did not have significant erythema around the incision itself but the suprapubic area where the JP drains that exited was severely edematous and malodorous. J-P drains were removed and cultures performed she was placed on Augmentin. This morning I called her and she reported the low-grade fever to 99 5 at home and was asked to be admitted for intravenous antibiotic therapy and CT scan. She had normal bowel function and normal lochia throughout, and she is breast-feeding without difficulty. Upon admission she has temperature to 99.5, white count showed leukocytosis 17,000 white count with a left shift. Metabolic panel is normal. Urine is negative. Culture from the office the day before admission is still pending CT scan is performed and shows tissue stranding and edema but no abscess loculation. The small amount of air in the subcutaneous spaces attributable to the recent JP drain which was removed 1 day before admission. She is admitted for IV antibiotic therapy and consideration of placing a peak of superficial vacuum drain over the old JP drain sites. Patient had been seen in the emergency room here 2 days ago at the onset of tenderness around the JP drains had had some Exparel injected around the drain sites, but apparently there was no erythema at that time and no suspicion of developing infection. White count was normal on that Sunday visit on postop day 7. Pertinent Gynecological History: MLast pap: normal Date:  OB  History: G4, P3   Menstrual History: Menarche age:  Patient's last menstrual period was 01/22/2015.    Past Medical History  Diagnosis Date  . Poor circulation   . History of staph infection 08-2011    Required surgical excision (Left thigh)  . Varicose veins   . Leg pain   . Hypoglycemia   . 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 Headache     migraines    Past Surgical History  Procedure Laterality Date  . Tonsillectomy      age 45  . Cesarean section  2008, 2012  . Leg skin lesion  biopsy / excision  08-2011    Left upper thigh skin excision for Staph Infection  . Endovenous ablation saphenous vein w/ laser Left 10-03-2013    left greater saphenous vein and stab phlebectomies > 20 incisions left leg by [name] MD   . Endovenous ablation saphenous vein w/ laser Right 10-17-2013    endovenous laser ablation with stab phlebectomy 10-20 incisions right leg  . Cesarean section with bilateral tubal ligation Bilateral 01/19/2015    Procedure: REPEAT CESAREAN SECTION;  Surgeon: Lesly Dukes, MD;  Location: [facility];  Service: Obstetrics;  Laterality: Bilateral;  . Tubal ligation  01/19/2015    Procedure: BILATERAL TUBAL LIGATION;  Surgeon: Lesly Dukes, MD;  Location: [facility];  Service: Obstetrics;;    Family History  Problem Relation Age of Onset  . Anemia Mother   . Other Mother     varicose veins  . Cancer Paternal Grandmother     liver  .  Thyroid disease Maternal Grandmother   . Other Sister     varicose veins  . Hypertension Father     Social History:  reports that she has never smoked. She has never used smokeless tobacco. She reports that she does not drink alcohol or use illicit drugs.  Allergies:  Allergies  Allergen Reactions  . Bactrim [Sulfamethoxazole-Trimethoprim] Nausea And Vomiting    Prescriptions prior to admission  Medication Sig Dispense Refill Last Dose  . amoxicillin-clavulanate (AUGMENTIN) 875-125 MG per tablet Take 1 tablet by  mouth 2 (two) times daily. 14 tablet 1   . clindamycin (CLEOCIN) 300 MG capsule Take 1 capsule (300 mg total) by mouth 3 (three) times daily. 21 capsule 0   . HYDROmorphone (DILAUDID) 2 MG tablet Take 0.5 tablets (1 mg total) by mouth every 4 (four) hours as needed for severe pain. 30 tablet 0   . ibuprofen (ADVIL,MOTRIN) 600 MG tablet Take 1 tablet (600 mg total) by mouth every 6 (six) hours. 50 tablet 1 Taking  . metoCLOPramide (REGLAN) 5 MG tablet Take 1 tablet (5 mg total) by mouth 3 (three) times daily before meals. 60 tablet 1 Taking  . OVER THE COUNTER MEDICATION Take 1 tablet by mouth daily. GAS X   Not Taking  . Prenatal Vit-Fe Fumarate-FA (PRENATAL MULTIVITAMIN) TABS tablet Take 1 tablet by mouth daily at 12 noon.   Taking    ROS  Blood pressure 107/53, pulse 99, temperature 98.8 F (37.1 C), temperature source Oral, resp. rate 16, height 5\' 5"  (1.651 m), weight 210 lb (95.255 kg), last menstrual period 01/22/2015, SpO2 98 %, currently breastfeeding. Physical Exam  Constitutional: She is oriented to person, place, and time. She appears well-developed and well-nourished.  Moderately uncomfortable, alert and oriented  HENT:  Head: Normocephalic and atraumatic.  Eyes: Pupils are equal, round, and reactive to light.  Neck: Normal range of motion.  Cardiovascular: Normal rate.   Respiratory: Effort normal.  GI: Soft. Bowel sounds are normal. She exhibits no distension.  The incision is intact without erythema around the old transverse incision. The inferior aspects of the incision are erythematous with peau d'orange edema of the symphysis pubis. The JP drain sites are still present open slightly.  Musculoskeletal: Normal range of motion.  Neurological: She is alert and oriented to person, place, and time. She has normal reflexes.  Skin: Skin is warm and dry.  Psychiatric: She has a normal mood and affect. Her behavior is normal. Judgment and thought content normal.  Patient remains  her amazingly cheerful demeanor throughout   CT results show no abscess location   Results for orders placed or performed during the hospital encounter of 01/28/15 (from the past 24 hour(s))  CBC WITH DIFFERENTIAL     Status: Abnormal   Collection Time: 01/28/15  1:06 PM  Result Value Ref Range   WBC 17.1 (H) 4.0 - 10.5 K/uL   RBC 3.80 (L) 3.87 - 5.11 MIL/uL   Hemoglobin 11.6 (L) 12.0 - 15.0 g/dL   HCT 16.133.7 (L) 09.636.0 - 04.546.0 %   MCV 88.7 78.0 - 100.0 fL   MCH 30.5 26.0 - 34.0 pg   MCHC 34.4 30.0 - 36.0 g/dL   RDW 40.912.3 81.111.5 - 91.415.5 %   Platelets 238 150 - 400 K/uL   Neutrophils Relative % 87 (H) 43 - 77 %   Neutro Abs 14.8 (H) 1.7 - 7.7 K/uL   Lymphocytes Relative 5 (L) 12 - 46 %   Lymphs Abs 0.9 0.7 -  4.0 K/uL   Monocytes Relative 7 3 - 12 %   Monocytes Absolute 1.2 (H) 0.1 - 1.0 K/uL   Eosinophils Relative 1 0 - 5 %   Eosinophils Absolute 0.1 0.0 - 0.7 K/uL   Basophils Relative 0 0 - 1 %   Basophils Absolute 0.0 0.0 - 0.1 K/uL  Comprehensive metabolic panel     Status: Abnormal   Collection Time: 01/28/15  1:06 PM  Result Value Ref Range   Sodium 135 135 - 145 mmol/L   Potassium 3.6 3.5 - 5.1 mmol/L   Chloride 100 (L) 101 - 111 mmol/L   CO2 27 22 - 32 mmol/L   Glucose, Bld 86 70 - 99 mg/dL   BUN 13 6 - 20 mg/dL   Creatinine, Ser 4.090.74 0.44 - 1.00 mg/dL   Calcium 8.1 (L) 8.9 - 10.3 mg/dL   Total Protein 6.1 (L) 6.5 - 8.1 g/dL   Albumin 2.6 (L) 3.5 - 5.0 g/dL   AST 24 15 - 41 U/L   ALT 26 14 - 54 U/L   Alkaline Phosphatase 87 38 - 126 U/L   Total Bilirubin 0.5 0.3 - 1.2 mg/dL   GFR calc non Af Amer >60 >60 mL/min   GFR calc Af Amer >60 >60 mL/min   Anion gap 8 5 - 15  Sedimentation rate     Status: Abnormal   Collection Time: 01/28/15  1:06 PM  Result Value Ref Range   Sed Rate 82 (H) 0 - 22 mm/hr  Urinalysis, Routine w reflex microscopic     Status: Abnormal   Collection Time: 01/28/15  2:10 PM  Result Value Ref Range   Color, Urine YELLOW YELLOW   APPearance CLEAR  CLEAR   Specific Gravity, Urine 1.010 1.005 - 1.030   pH 6.0 5.0 - 8.0   Glucose, UA NEGATIVE NEGATIVE mg/dL   Hgb urine dipstick LARGE (A) NEGATIVE   Bilirubin Urine NEGATIVE NEGATIVE   Ketones, ur 15 (A) NEGATIVE mg/dL   Protein, ur NEGATIVE NEGATIVE mg/dL   Urobilinogen, UA 0.2 0.0 - 1.0 mg/dL   Nitrite NEGATIVE NEGATIVE   Leukocytes, UA SMALL (A) NEGATIVE  Urine microscopic-add on     Status: Abnormal   Collection Time: 01/28/15  2:10 PM  Result Value Ref Range   Squamous Epithelial / LPF FEW (A) RARE   WBC, UA 0-2 <3 WBC/hpf   RBC / HPF 0-2 <3 RBC/hpf    Ct Abdomen Pelvis W Contrast  01/28/2015   CLINICAL DATA:  34 year old female status post Cesarean section on 01/19/2015 with panniculectomy and tubal ligation. Postoperative wound cellulitis and low-grade fever. Evaluate for wound infection.  EXAM: CT ABDOMEN AND PELVIS WITH CONTRAST  TECHNIQUE: Multidetector CT imaging of the abdomen and pelvis was performed using the standard protocol following bolus administration of intravenous contrast.  CONTRAST:  100mL OMNIPAQUE IOHEXOL 300 MG/ML  SOLN  COMPARISON:  No priors.  FINDINGS: Lower chest:  Unremarkable.  Hepatobiliary: No cystic or solid hepatic lesions. No intra or extrahepatic biliary ductal dilatation. Gallbladder is normal in appearance.  Pancreas: Unremarkable.  Spleen: Unremarkable.  Adrenals/Urinary Tract: Bilateral adrenal glands and bilateral kidneys are normal in appearance. No hydroureteronephrosis. Urinary bladder is normal in appearance.  Stomach/Bowel: Normal appearance of the stomach. No pathologic dilatation of small bowel or colon.  Vascular/Lymphatic: No significant atherosclerotic disease or aneurysm identified in the abdominal or pelvic vasculature. No lymphadenopathy noted in the abdomen or pelvis. Multiple borderline enlarged rib bilateral inguinal lymph nodes are  noted, presumably reactive.  Reproductive: Postoperative changes in the anterior wall of the uterus in  the lower uterine segment related to recent cesarean section. Bilateral tubal ligation clips. Ovaries are unremarkable in appearance.  Other: No significant volume of ascites.  No pneumoperitoneum.  Musculoskeletal: Postoperative changes in lower anterior abdominal wall related to recent panniculectomy and cesarian section. There is soft tissue stranding throughout the subcutaneous fat of this region extending into the vulvar regions bilaterally, and a small amount of gas in the deep soft tissues. No well-defined fluid collection is identified to suggest presence of abscess at this time. There are no aggressive appearing lytic or blastic lesions noted in the visualized portions of the skeleton.  IMPRESSION: 1. Postoperative changes related to recent cesarean section and panniculectomy, as above, with extensive soft tissue stranding throughout the subcutaneous fat of the lower anterior abdominal wall extending into the vulvar regions bilaterally, which corresponds to the reported clinical history of cellulitis. There is a small amount of gas in the deep soft tissues, likely within normal limits for this recently postoperative patient. No well-defined fluid collection is identified at this time to suggest the presence of a wound abscess. 2. No other acute findings noted in the abdomen or pelvis.   Electronically Signed   By: Trudie Reed M.D.   On: 01/28/2015 15:16    Assessment/Plan: Postoperative wound cellulitis due to inflammation and infection of J-P drains. Drains are now removed No evidence of abscess Plan IV antibiotic therapy consider placing wound VAC placement based on condition of the incision on morning evaluation in a.m.  Felder Lebeda V 01/28/2015, 9:41 PM

## 2015-01-28 NOTE — Telephone Encounter (Signed)
Patient feels slightly worse today than yesterday. Temp to 99.5  Last night. [facility] CT [facility]. Hospital admit arranged, and pt informed. Dr Jolayne Pantheronstant aware of admit. Orders placed in epicfor admission for wound infection.

## 2015-01-29 LAB — WOUND CULTURE

## 2015-01-29 MED ORDER — VANCOMYCIN HCL IN DEXTROSE 1-5 GM/200ML-% IV SOLN
1000.0000 mg | Freq: Three times a day (TID) | INTRAVENOUS | Status: DC
  Administered 2015-01-29 – 2015-01-31 (×5): 1000 mg via INTRAVENOUS
  Filled 2015-01-29 (×7): qty 200

## 2015-01-29 NOTE — Progress Notes (Signed)
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 CT negative for abscess, Pos for edema and tissue stranding  Subjective: Patient reports that mons remains edematous , but the pain is far better.. Gram stain was Multiple Gram Pos cocci. Will add vancomycin to expand coverage.  Objective: I have reviewed patient's vital signs, intake and output, medications and labs. Final Culture pending from 5/3 in office  General: alert, cooperative, no distress and uncomfortable but improving.  Resp: clear to auscultation bilaterally GI: soft, non-tender; bowel sounds normal; no masses,  no organomegaly and incision: erythematous Vaginal Bleeding: minimal Incision is actually clean, area around drain sites have continued edema and erythema , but improved  Assessment: s/p Cesarean and wide excision of cicatrix, with cellulitis at site of suprapubic JP drain   Plan: add vancomycin, await Culture ID of cocci will add PICO drain as the right JP drain site is still open. Probable 2 more days in hospital.  LOS: 1 day    Tara Rocha V 01/29/2015, 9:06 AM

## 2015-01-29 NOTE — Progress Notes (Signed)
Pt had PICO drain applied per Dr. Jarrett Ablesrders Pt tolerated procedure well Questions asked and answered. Drain is draining well.

## 2015-01-29 NOTE — Progress Notes (Addendum)
ANTIBIOTIC CONSULT NOTE - INITIAL  Pharmacy Consult for Vancomycin Dosing Indication: Severe cellulitis at site of JP drains  Allergies  Allergen Reactions  . Bactrim [Sulfamethoxazole-Trimethoprim] Nausea And Vomiting    Patient Measurements: Height: 5\' 5"  (165.1 cm) Weight: 210 lb (95.255 kg) IBW/kg (Calculated) : 57 kg Adjusted Body Weight: 72 kg   Vital Signs: Temp: 97.8 F (36.6 C) (05/05 0629) Temp Source: Oral (05/05 0629) BP: 102/52 mmHg (05/05 0629) Pulse Rate: 73 (05/05 0629) Intake/Output from previous day: 05/04 0701 - 05/05 0700 In: 2630.8 [P.O.:2040; I.V.:490.8; IV Piggyback:100] Out: 2350 [Urine:2350] Intake/Output from this shift: Total I/O In: -  Out: 500 [Urine:500]  Labs:  Recent Labs  01/28/15 1306  WBC 17.1*  HGB 11.6*  PLT 238  CREATININE 0.74   Estimated Creatinine Clearance: 116.3 mL/min (by C-G formula based on Cr of 0.74). No results for input(s): VANCOTROUGH, VANCOPEAK, VANCORANDOM, GENTTROUGH, GENTPEAK, GENTRANDOM, TOBRATROUGH, TOBRAPEAK, TOBRARND, AMIKACINPEAK, AMIKACINTROU, AMIKACIN in the last 72 hours.   Microbiology: Recent Results (from the past 720 hour(s))  Culture, Grp B Strep w/Rflx Suscept     Status: None   Collection Time: 01/01/15  8:52 AM  Result Value Ref Range Status   Culture GROUP B STREP (S.AGALACTIAE) ISOLATED  Final   Organism ID, Bacteria GROUP B STREP (S.AGALACTIAE) ISOLATED  Final      Susceptibility   Group b strep (s.agalactiae) isolated -  (no method available)    PENICILLIN <=0.06 Sensitive     AMPICILLIN <=0.25 Sensitive     LEVOFLOXACIN 1 Sensitive     VANCOMYCIN 0.5 Sensitive     CLINDAMYCIN <=0.25 Sensitive     ERYTHROMYCIN <=0.12 Sensitive   GC/Chlamydia Probe Amp     Status: None   Collection Time: 01/01/15  8:52 AM  Result Value Ref Range Status   CT Probe RNA NEGATIVE  Final   GC Probe RNA NEGATIVE  Final    Comment:                                                                                        **Normal Reference Range: Negative**         Assay performed using the Gen-Probe APTIMA COMBO2 (R) Assay.   Acceptable specimen types for this assay include APTIMA Swabs (Unisex, endocervical, urethral, or vaginal), first void urine, and ThinPrep liquid based cytology samples.   Wound culture     Status: Abnormal (Preliminary result)   Collection Time: 01/27/15  4:00 PM  Result Value Ref Range Status   Aerobic Bacterial Culture Preliminary report (A)  Preliminary   Result 1 Staphylococcus aureus (A)  Preliminary    Comment: Heavy growth    Medical History: Past Medical History  Diagnosis Date  . Poor circulation   . History of staph infection 08-2011    Required surgical excision (Left thigh)  . Varicose veins   . Leg pain   . Hypoglycemia   . 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 Headache     migraines    Medications:  Scheduled:  . docusate sodium  100 mg Oral BID  . enoxaparin (LOVENOX) injection  40  mg Subcutaneous Q24H  . piperacillin-tazobactam (ZOSYN)  IV  3.375 g Intravenous 3 times per day  . prenatal multivitamin  1 tablet Oral Q1200  . vancomycin  1,000 mg Intravenous Q8H   Assessment: S/p cesarean and wide excision of cicatrix, with cellulitis at site of suprapubic JP drain   Goal of Therapy:  Vancomycin trough 10-15 mcg/mL  Plan:  Vancomycin 1000 mg IV q 8 hrs Await culture ID of cocci; if continuing vancomycin after culture results measure troughs at steady state (before the 4th dose)   Tara Rocha 01/29/2015,10:09 AM

## 2015-01-30 ENCOUNTER — Encounter (HOSPITAL_COMMUNITY): Payer: Self-pay | Admitting: Anesthesiology

## 2015-01-30 ENCOUNTER — Encounter: Payer: BLUE CROSS/BLUE SHIELD | Admitting: Obstetrics and Gynecology

## 2015-01-30 LAB — CBC
HCT: 34.3 % — ABNORMAL LOW (ref 36.0–46.0)
Hemoglobin: 11.6 g/dL — ABNORMAL LOW (ref 12.0–15.0)
MCH: 30.4 pg (ref 26.0–34.0)
MCHC: 33.8 g/dL (ref 30.0–36.0)
MCV: 90 fL (ref 78.0–100.0)
Platelets: 280 10*3/uL (ref 150–400)
RBC: 3.81 MIL/uL — ABNORMAL LOW (ref 3.87–5.11)
RDW: 12.4 % (ref 11.5–15.5)
WBC: 9.8 10*3/uL (ref 4.0–10.5)

## 2015-01-30 NOTE — Progress Notes (Signed)
Subjective: pain is better Postpartum Day 12: Cesarean Delivery Patient reports incisional pain and tolerating PO.    Objective: Vital signs in last 24 hours: Temp:  [97.6 F (36.4 C)-98.5 F (36.9 C)] 97.8 F (36.6 C) (05/06 0937) Pulse Rate:  [71-95] 71 (05/06 0501) Resp:  [16-20] 20 (05/06 0501) BP: (108-122)/(54-65) 115/65 mmHg (05/06 0501) SpO2:  [97 %-100 %] 100 % (05/06 0501)  Physical Exam:  General: alert, cooperative and no distress Lochia: appropriate Uterine Fundus: well below umbilicus Incision: PICO drain in place mons is firm and tender mild bruising DVT Evaluation: No evidence of DVT seen on physical exam.   Recent Labs  01/28/15 1306 01/30/15 0524  HGB 11.6* 11.6*  HCT 33.7* 34.3*   CBC    Component Value Date/Time   WBC 9.8 01/30/2015 0524   RBC 3.81* 01/30/2015 0524   HGB 11.6* 01/30/2015 0524   HCT 34.3* 01/30/2015 0524   PLT 280 01/30/2015 0524   MCV 90.0 01/30/2015 0524   MCH 30.4 01/30/2015 0524   MCHC 33.8 01/30/2015 0524   RDW 12.4 01/30/2015 0524   LYMPHSABS 0.9 01/28/2015 1306   MONOABS 1.2* 01/28/2015 1306   EOSABS 0.1 01/28/2015 1306   BASOSABS 0.0 01/28/2015 1306      Assessment/Plan: Status post Cesarean section. Postoperative course complicated by wound infection improved but still tender  Continue current care.  ARNOLD,JAMES 01/30/2015, 10:24 AM

## 2015-01-31 ENCOUNTER — Inpatient Hospital Stay (HOSPITAL_COMMUNITY): Payer: BLUE CROSS/BLUE SHIELD

## 2015-01-31 ENCOUNTER — Encounter (HOSPITAL_COMMUNITY)
Admission: AD | Disposition: A | Discharge status: Home Health | Payer: Self-pay | Source: Ambulatory Visit | Attending: Obstetrics and Gynecology

## 2015-01-31 DIAGNOSIS — L02215 Cutaneous abscess of perineum: Secondary | ICD-10-CM

## 2015-01-31 DIAGNOSIS — O860 Infection of obstetric surgical wound: Secondary | ICD-10-CM

## 2015-01-31 DIAGNOSIS — L03311 Cellulitis of abdominal wall: Secondary | ICD-10-CM

## 2015-01-31 DIAGNOSIS — T8579XA Infection and inflammatory reaction due to other internal prosthetic devices, implants and grafts, initial encounter: Secondary | ICD-10-CM

## 2015-01-31 HISTORY — PX: INCISION AND DRAINAGE ABSCESS: SHX5864

## 2015-01-31 LAB — SURGICAL PCR SCREEN
MRSA, PCR: NEGATIVE
Staphylococcus aureus: NEGATIVE

## 2015-01-31 SURGERY — INCISION AND DRAINAGE, ABSCESS
Anesthesia: General | Site: Abdomen

## 2015-01-31 MED ORDER — ONDANSETRON HCL 4 MG/2ML IJ SOLN
INTRAMUSCULAR | Status: AC
  Filled 2015-01-31: qty 2

## 2015-01-31 MED ORDER — ONDANSETRON HCL 4 MG/2ML IJ SOLN: 4.0000 mg | Freq: Four times a day (QID) | INTRAMUSCULAR | Status: DC | PRN

## 2015-01-31 MED ORDER — DEXAMETHASONE SODIUM PHOSPHATE 4 MG/ML IJ SOLN
INTRAMUSCULAR | Status: AC
  Filled 2015-01-31: qty 1

## 2015-01-31 MED ORDER — ROCURONIUM BROMIDE 100 MG/10ML IV SOLN
INTRAVENOUS | Status: AC
  Filled 2015-01-31: qty 1

## 2015-01-31 MED ORDER — OXYCODONE-ACETAMINOPHEN 5-325 MG PO TABS
1.0000 | ORAL_TABLET | ORAL | Status: DC | PRN
  Filled 2015-01-31: qty 1

## 2015-01-31 MED ORDER — HYDROMORPHONE HCL 1 MG/ML IJ SOLN
INTRAMUSCULAR | Status: AC
  Administered 2015-01-31: 0.5 mg via INTRAVENOUS
  Filled 2015-01-31: qty 1

## 2015-01-31 MED ORDER — KETOROLAC TROMETHAMINE 30 MG/ML IJ SOLN: 30.0000 mg | Freq: Four times a day (QID) | INTRAMUSCULAR | Status: DC

## 2015-01-31 MED ORDER — MIDAZOLAM HCL 2 MG/2ML IJ SOLN
INTRAMUSCULAR | Status: AC
  Filled 2015-01-31: qty 2

## 2015-01-31 MED ORDER — PANTOPRAZOLE SODIUM 40 MG PO TBEC
40.0000 mg | DELAYED_RELEASE_TABLET | Freq: Every day | ORAL | Status: DC
  Administered 2015-02-01 – 2015-02-02 (×2): 40 mg via ORAL
  Filled 2015-01-31 (×2): qty 1

## 2015-01-31 MED ORDER — SCOPOLAMINE 1 MG/3DAYS TD PT72
MEDICATED_PATCH | TRANSDERMAL | Status: DC | PRN
  Administered 2015-01-31: 1 via TRANSDERMAL

## 2015-01-31 MED ORDER — SODIUM CHLORIDE 0.9 % IV SOLN
INTRAVENOUS | Status: DC
  Administered 2015-02-01 – 2015-02-02 (×5): via INTRAVENOUS

## 2015-01-31 MED ORDER — LACTATED RINGERS IV SOLN: INTRAVENOUS | Status: DC

## 2015-01-31 MED ORDER — ACETAMINOPHEN 10 MG/ML IV SOLN
1000.0000 mg | Freq: Four times a day (QID) | INTRAVENOUS | Status: DC
  Administered 2015-01-31: 1000 mg via INTRAVENOUS
  Filled 2015-01-31: qty 100

## 2015-01-31 MED ORDER — IBUPROFEN 600 MG PO TABS: 600.0000 mg | ORAL_TABLET | Freq: Four times a day (QID) | ORAL | Status: DC | PRN

## 2015-01-31 MED ORDER — KETOROLAC TROMETHAMINE 30 MG/ML IJ SOLN
INTRAMUSCULAR | Status: DC | PRN
  Administered 2015-01-31: 30 mg via INTRAVENOUS

## 2015-01-31 MED ORDER — KETOROLAC TROMETHAMINE 30 MG/ML IJ SOLN
INTRAMUSCULAR | Status: AC
  Filled 2015-01-31: qty 1

## 2015-01-31 MED ORDER — LACTATED RINGERS IV SOLN
INTRAVENOUS | Status: DC | PRN
  Administered 2015-01-31 (×2): via INTRAVENOUS

## 2015-01-31 MED ORDER — BUPIVACAINE HCL 0.25 % IJ SOLN
INTRAMUSCULAR | Status: DC | PRN
  Administered 2015-01-31: 20 mL

## 2015-01-31 MED ORDER — MIDAZOLAM HCL 2 MG/2ML IJ SOLN
0.5000 mg | Freq: Once | INTRAMUSCULAR | Status: AC
  Administered 2015-01-31: 0.5 mg via INTRAVENOUS

## 2015-01-31 MED ORDER — PROPOFOL 10 MG/ML IV BOLUS
INTRAVENOUS | Status: DC | PRN
  Administered 2015-01-31: 200 mg via INTRAVENOUS

## 2015-01-31 MED ORDER — FENTANYL CITRATE (PF) 100 MCG/2ML IJ SOLN
INTRAMUSCULAR | Status: AC
  Filled 2015-01-31: qty 2

## 2015-01-31 MED ORDER — VANCOMYCIN HCL IN DEXTROSE 1-5 GM/200ML-% IV SOLN
1000.0000 mg | Freq: Three times a day (TID) | INTRAVENOUS | Status: DC
  Administered 2015-01-31 – 2015-02-01 (×3): 1000 mg via INTRAVENOUS
  Filled 2015-01-31 (×3): qty 200

## 2015-01-31 MED ORDER — FENTANYL CITRATE (PF) 100 MCG/2ML IJ SOLN
INTRAMUSCULAR | Status: DC | PRN
  Administered 2015-01-31 (×4): 50 ug via INTRAVENOUS
  Administered 2015-01-31: 100 ug via INTRAVENOUS

## 2015-01-31 MED ORDER — SCOPOLAMINE 1 MG/3DAYS TD PT72
MEDICATED_PATCH | TRANSDERMAL | Status: AC
  Filled 2015-01-31: qty 1

## 2015-01-31 MED ORDER — ENOXAPARIN SODIUM 40 MG/0.4ML ~~LOC~~ SOLN
40.0000 mg | SUBCUTANEOUS | Status: DC
  Administered 2015-02-01 – 2015-02-02 (×2): 40 mg via SUBCUTANEOUS
  Filled 2015-01-31 (×3): qty 0.4

## 2015-01-31 MED ORDER — BUPIVACAINE HCL (PF) 0.25 % IJ SOLN
INTRAMUSCULAR | Status: AC
  Filled 2015-01-31: qty 30

## 2015-01-31 MED ORDER — KETOROLAC TROMETHAMINE 30 MG/ML IJ SOLN
30.0000 mg | Freq: Four times a day (QID) | INTRAMUSCULAR | Status: DC
  Administered 2015-01-31 – 2015-02-02 (×7): 30 mg via INTRAVENOUS
  Filled 2015-01-31 (×7): qty 1

## 2015-01-31 MED ORDER — MIDAZOLAM HCL 2 MG/2ML IJ SOLN
INTRAMUSCULAR | Status: DC | PRN
  Administered 2015-01-31: 0.5 mg via INTRAVENOUS

## 2015-01-31 MED ORDER — DEXAMETHASONE SODIUM PHOSPHATE 10 MG/ML IJ SOLN
INTRAMUSCULAR | Status: DC | PRN
  Administered 2015-01-31: 4 mg via INTRAVENOUS

## 2015-01-31 MED ORDER — ONDANSETRON HCL 4 MG PO TABS: 4.0000 mg | ORAL_TABLET | Freq: Four times a day (QID) | ORAL | Status: DC | PRN

## 2015-01-31 MED ORDER — CLINDAMYCIN PHOSPHATE 900 MG/50ML IV SOLN
900.0000 mg | Freq: Three times a day (TID) | INTRAVENOUS | Status: DC
  Administered 2015-01-31 – 2015-02-01 (×2): 900 mg via INTRAVENOUS
  Filled 2015-01-31 (×3): qty 50

## 2015-01-31 MED ORDER — FENTANYL CITRATE (PF) 100 MCG/2ML IJ SOLN: 25.0000 ug | INTRAMUSCULAR | Status: DC | PRN

## 2015-01-31 MED ORDER — PIPERACILLIN-TAZOBACTAM 3.375 G IVPB: 3.3750 g | Freq: Three times a day (TID) | INTRAVENOUS | Status: DC

## 2015-01-31 MED ORDER — ONDANSETRON HCL 4 MG/2ML IJ SOLN
INTRAMUSCULAR | Status: DC | PRN
  Administered 2015-01-31: 4 mg via INTRAVENOUS

## 2015-01-31 MED ORDER — LIDOCAINE HCL (CARDIAC) 20 MG/ML IV SOLN
INTRAVENOUS | Status: AC
  Filled 2015-01-31: qty 5

## 2015-01-31 MED ORDER — PROMETHAZINE HCL 25 MG/ML IJ SOLN: 6.2500 mg | INTRAMUSCULAR | Status: DC | PRN

## 2015-01-31 MED ORDER — GLYCOPYRROLATE 0.2 MG/ML IJ SOLN
INTRAMUSCULAR | Status: AC
  Filled 2015-01-31: qty 1

## 2015-01-31 MED ORDER — LIDOCAINE HCL (CARDIAC) 20 MG/ML IV SOLN
INTRAVENOUS | Status: DC | PRN
  Administered 2015-01-31: 80 mg via INTRAVENOUS

## 2015-01-31 MED ORDER — KETOROLAC TROMETHAMINE 30 MG/ML IJ SOLN
30.0000 mg | Freq: Once | INTRAMUSCULAR | Status: DC
Start: 2015-01-31 — End: 2015-01-31

## 2015-01-31 MED ORDER — HYDROMORPHONE HCL 1 MG/ML IJ SOLN
0.2500 mg | INTRAMUSCULAR | Status: DC | PRN
Start: 2015-01-31 — End: 2015-01-31
  Administered 2015-01-31: 0.5 mg via INTRAVENOUS

## 2015-01-31 MED ORDER — PROPOFOL 10 MG/ML IV BOLUS
INTRAVENOUS | Status: AC
  Filled 2015-01-31: qty 20

## 2015-01-31 MED ORDER — MEPERIDINE HCL 25 MG/ML IJ SOLN: 6.2500 mg | INTRAMUSCULAR | Status: DC | PRN

## 2015-01-31 MED ORDER — GLYCOPYRROLATE 0.2 MG/ML IJ SOLN
INTRAMUSCULAR | Status: DC | PRN
  Administered 2015-01-31: 0.2 mg via INTRAVENOUS

## 2015-01-31 SURGICAL SUPPLY — 23 items
BINDER ABDOMINAL 12 ML 46-62 (SOFTGOODS) ×6 IMPLANT
CANISTER SUCTION 2500CC (MISCELLANEOUS) ×2 IMPLANT
CLOTH BEACON ORANGE TIMEOUT ST (SAFETY) ×2 IMPLANT
GAUZE PACKING 1/2X5YD (GAUZE/BANDAGES/DRESSINGS) ×6 IMPLANT
GAUZE PACKING 2X5 YD STRL (GAUZE/BANDAGES/DRESSINGS) ×2 IMPLANT
GAUZE PACKING IODOFORM 2 (PACKING) ×6 IMPLANT
GLOVE BIO SURGEON ST LM GN SZ9 (GLOVE) ×2 IMPLANT
GLOVE BIOGEL PI IND STRL 7.0 (GLOVE) IMPLANT
GLOVE BIOGEL PI INDICATOR 7.0 (GLOVE) ×2
GLOVE ECLIPSE 7.0 STRL STRAW (GLOVE) ×2 IMPLANT
GOWN SURG XXL (GOWNS) ×2 IMPLANT
GOWN SURGICAL XLG (GOWNS) ×2 IMPLANT
PACK ABDOMINAL MINOR (CUSTOM PROCEDURE TRAY) ×2 IMPLANT
PAD ABD 8X7 1/2 STERILE (GAUZE/BANDAGES/DRESSINGS) ×6 IMPLANT
PENCIL BUTTON HOLSTER BLD 10FT (ELECTRODE) ×2 IMPLANT
SPONGE GAUZE 4X4 12PLY STER LF (GAUZE/BANDAGES/DRESSINGS) ×6 IMPLANT
SPONGE LAP 18X18 X RAY DECT (DISPOSABLE) ×4 IMPLANT
SWAB COLLECTION DEVICE MRSA (MISCELLANEOUS) ×2 IMPLANT
SYR BULB IRRIGATION 50ML (SYRINGE) ×2 IMPLANT
TAPE HYPAFIX 4 X10 (GAUZE/BANDAGES/DRESSINGS) ×6 IMPLANT
TUBE ANAEROBIC SPECIMEN COL (MISCELLANEOUS) ×2 IMPLANT
TUBING SUCTION BULK 100 FT (MISCELLANEOUS) ×2 IMPLANT
YANKAUER SUCT BULB TIP NO VENT (SUCTIONS) ×2 IMPLANT

## 2015-01-31 NOTE — Progress Notes (Signed)
Day 12 s/p cesarean section and wide excision of cicatrix, with postop wound infection particularly involving right mons pubis at and around site of JP drain. Pt remains quite sore and edematous around the mons pubis, She is tolerating diet with regular bowel movements, has been breast feeding without difficuty, and experiencing significant pain with contact to area of concern  Subjective: Patient reports incisional pain, tolerating PO, + flatus, + BM and no problems voiding.    Objective: I have reviewed patient's vital signs, intake and output, medications, labs, microbiology and wound culture Staph Aureus.  General: alert, cooperative, mild distress and moderately obese Resp: clear to auscultation bilaterally Cardio: regular rate and rhythm GI: incision: indurated, purulent drainage present and draining without odor from the JP drain site on right and from a small spont site draining lower on mons pubis  Assessment: s/p cesarean and wide excision of cicatrix with postop wound infection, now with identifiable abscess loculation Extending from incision to right mons pubis  Plan: NPO to OR this PM when NPO x 8 hrs.  will place wet to dry dressings, daily wound care til a candidate for wound vac. Plan explained to pt with husband present.  LOS: 3 days    Tara Rocha V 01/31/2015, 11:50 AM

## 2015-01-31 NOTE — Progress Notes (Signed)
ANTIBIOTIC CONSULT NOTE - INITIAL  Pharmacy Consult for Vancomycin Indication: Wound infection with possible Necrotizing fasciitis  Allergies  Allergen Reactions  . Bactrim [Sulfamethoxazole-Trimethoprim] Nausea And Vomiting    Patient Measurements: Height: 5\' 5"  (165.1 cm) Weight: 210 lb (95.255 kg) IBW/kg (Calculated) : 57 Adjusted Body Weight:   Vital Signs: Temp: 98.3 F (36.8 C) (05/07 1900) Temp Source: Oral (05/07 1444) BP: 110/66 mmHg (05/07 1900) Pulse Rate: 55 (05/07 1900)  Labs:  Recent Labs  01/30/15 0524  WBC 9.8  HGB 11.6*  PLT 280   No results for input(s): VANCOTROUGH, VANCOPEAK, VANCORANDOM, GENTTROUGH, GENTPEAK, GENTRANDOM in the last 72 hours.   Microbiology: Recent Results (from the past 720 hour(s))  Wound culture     Status: Abnormal   Collection Time: 01/27/15  4:00 PM  Result Value Ref Range Status   Gram Stain Result Final report  Final   Result 1 Comment  Final    Comment: Few white blood cells.   RESULT 2 Comment  Final    Comment: Moderate number of gram positive cocci.   Aerobic Bacterial Culture Final report (A)  Final   Result 1 Staphylococcus aureus (A)  Final    Comment: Heavy growth Based on resistance to penicillin and susceptibility to oxacillin this isolate would be susceptible to: * Penicillinase-stable penicillins; such as:     Cloxacillin     Dicloxacillin     Nafcillin * Beta-lactam/beta-lactamase inhibitor combinations; such as:     Amoxicillin-clavulanic acid     Ampicillin-sulbactam * Antistaphylococcal cephems; such as:     Cefaclor     Cefuroxime * Antistaphylococcal carbapenems; such as:     Imipenem     Meropenem    ANTIMICROBIAL SUSCEPTIBILITY Comment  Final    Comment:       ** S = Susceptible; I = Intermediate; R = Resistant **                    P = Positive; N = Negative             MICS are expressed in micrograms per mL    Antibiotic                 RSLT#1    RSLT#2    RSLT#3     RSLT#4 Ciprofloxacin                  S Clindamycin                    S Erythromycin                   S Gentamicin                     S Levofloxacin                   S Linezolid                      S Moxifloxacin                   S Oxacillin                      S Penicillin                     R Quinupristin/Dalfopristin      S Rifampin  S Tetracycline                   S Trimethoprim/Sulfa             R Vancomycin                     S   Surgical pcr screen     Status: None   Collection Time: 01/31/15  1:25 PM  Result Value Ref Range Status   MRSA, PCR NEGATIVE NEGATIVE Final   Staphylococcus aureus NEGATIVE NEGATIVE Final    Comment:        The Xpert SA Assay (FDA approved for NASAL specimens in patients over 32 years of age), is one component of a comprehensive surveillance program.  Test performance has been validated by [facility] for patients greater than or equal to 558 year old. It is not intended to diagnose infection nor to guide or monitor treatment.     Medications:  Clindamycin 900mg  IV q8hrs Zosyn 3.375gm IV Q8hrs  Assessment: 34 y.o. female s/p Cesarean section with wound abscess, s/p I&D, on zosyn,now with possible necrotizing fasiitis.   Goal of Therapy:  Vancomycin Trough=15 mg/L  Plan:  Restart Vancomycin 1000mg  IV q8hrs  Will check vancomycin trough at steady-state around the 5th dose or when clinically indicated.  Wendie Simmerynthia Zemirah Krasinski, PharmD, BCPS Clinical Pharmacist  Pager: [phone]

## 2015-01-31 NOTE — Transfer of Care (Signed)
Immediate Anesthesia Transfer of Care Note  Patient: Tara Rocha  Procedure(s) Performed: Procedure(s): INCISION AND DRAINAGE ABSCESS (N/A)  Patient Location: PACU  Anesthesia Type:General  Level of Consciousness: awake, alert  and oriented  Airway & Oxygen Therapy: Patient Spontanous Breathing and Patient connected to nasal cannula oxygen  Post-op Assessment: Report given to RN and Post -op Vital signs reviewed and stable  Post vital signs: Reviewed and stable  Last Vitals:  Filed Vitals:   01/31/15 1444  BP:   Pulse:   Temp: 36.9 C  Resp:     Complications: No apparent anesthesia complications

## 2015-01-31 NOTE — Op Note (Signed)
01/28/2015 - 01/31/2015  5:12 PM  PATIENT:  Tara Rocha  34 y.o. female  PRE-OPERATIVE DIAGNOSIS:  s/p Cesarean section; wound abcess  POST-OPERATIVE DIAGNOSIS:  s/p Cesarean section; wound abcess, diffuse cellulitis of mons pubis  PROCEDURE:  Procedure(s): INCISION AND DRAINAGE ABSCESS (N/A), with placement of wet to dry dressing  SURGEON:  Surgeon(s) and Role:    * Tilda BurrowJohn Roxan Yamamoto V, MD - Primary  PHYSICIAN ASSISTANT:   ASSISTANTS: none   ANESTHESIA:   local and general  EBL:  Total I/O In: 1950 [P.O.:950; I.V.:1000] Out: 850 [Urine:800; Blood:50]  BLOOD ADMINISTERED:none  DRAINS: wet-dry dressing packing of incisions   LOCAL MEDICATIONS USED:  MARCAINE    and Amount: 20  ml Details of procedure patient was taken operating room prepped and draped for exploration of the of the draining  abscess in the area of the mons pubis centered around the right JP drain site and extending inferior toward the upper aspects the labia majora central 12 cm of the transverse abdominal incision was opened and was found to have minimal watery fluid with no purulence. The JP drain site on the right was opened from location 1/2 cm below the transverse incision inferiorly for a distance of 3 cm into the area just above and to the right of the clitoris with gross purulence obtained. Suctioning is performed to remove the purulence and Russian forceps used to debride the base. Initially the P drain site did not have identifiable communications with the abdominal incision, but a channel was noted and the soft tissue extending to the patient's right inferior to the main lower abdominal incision. This was debrided and opened up over its surface approximately 2 cm lateral resulting in an L-shaped incision in the patient's right side, located approximately 2 cm here to the main incision.  The left JP drain site was probed and found to have a deep communication to the transverse abdominal incision, which was opened  up and irrigated copiously. No necrotic tissue was identified. The center of the mons pubis had a small loculation which was opened up from above,, and irrigated copiously. Vigorous palpation over the areas of the incision could not identify any further loculations. After thorough irrigation and probing of the incision we were able to ensure that all the 3 sites communicated, and the bed of each site was debrided to easily bleeding tissue indicating good vascularity. Wound was packed with 2 inch sterile gauze extending well up on the patient's right side. The JP drain site was lightly packed with iodoform 2 inch gauze, the area in the center of the mons pubis lightly packed with iodoform gauze and the left JP drain also placed with a small portion of gauze, then 4 x 4's placed over in a wet-to-dry dressing fashion . The periphery of the dissection area was infiltrated subcutaneously with 20 cc of Marcaine and then covered with 2 ABD dressings and taped in place.    SPECIMEN:  No Specimen cultures repeated.  DISPOSITION OF SPECIMEN:    COUNTS:  YES  TOURNIQUET:None  DICTATION: .Office managerDragon Dictation  PLAN OF CARE: has inpatient order for admit  PATIENT DISPOSITION:  PACU - hemodynamically stable.   Delay start of Pharmacological VTE agent (>24hrs) due to surgical blood loss or risk of bleeding: not applicable

## 2015-01-31 NOTE — Brief Op Note (Signed)
01/28/2015 - 01/31/2015  5:12 PM  PATIENT:  Tara Rocha  34 y.o. female  PRE-OPERATIVE DIAGNOSIS:  s/p Cesarean section; wound abcess  POST-OPERATIVE DIAGNOSIS:  s/p Cesarean section; wound abcess  PROCEDURE:  Procedure(s): INCISION AND DRAINAGE ABSCESS (N/A)  SURGEON:  Surgeon(s) and Role:    * Tilda BurrowJohn Olander Friedl V, MD - Primary  PHYSICIAN ASSISTANT:   ASSISTANTS: none   ANESTHESIA:   local and general  EBL:  Total I/O In: 1950 [P.O.:950; I.V.:1000] Out: 850 [Urine:800; Blood:50]  BLOOD ADMINISTERED:none  DRAINS: wet-dry dressing packing of incisions   LOCAL MEDICATIONS USED:  MARCAINE    and Amount: 20  ml  SPECIMEN:  No Specimen cultures repeated.  DISPOSITION OF SPECIMEN:    COUNTS:  YES  TOURNIQUET:  * No tourniquets in log *  DICTATION: .Dragon Dictation  PLAN OF CARE: has inpatient order for admit  PATIENT DISPOSITION:  PACU - hemodynamically stable.   Delay start of Pharmacological VTE agent (>24hrs) due to surgical blood loss or risk of bleeding: not applicable

## 2015-01-31 NOTE — Anesthesia Preprocedure Evaluation (Signed)
Anesthesia Evaluation  Patient identified by MRN, date of birth, ID band Patient awake    Reviewed: Allergy & Precautions, NPO status , Patient's Chart, lab work & pertinent test results  Airway Mallampati: II  TM Distance: >3 FB Neck ROM: Full    Dental no notable dental hx.    Pulmonary neg pulmonary ROS,  breath sounds clear to auscultation  Pulmonary exam normal       Cardiovascular negative cardio ROS Normal cardiovascular examRhythm:Regular Rate:Normal     Neuro/Psych negative neurological ROS  negative psych ROS   GI/Hepatic negative GI ROS, Neg liver ROS,   Endo/Other  negative endocrine ROS  Renal/GU negative Renal ROS  negative genitourinary   Musculoskeletal negative musculoskeletal ROS (+)   Abdominal   Peds negative pediatric ROS (+)  Hematology negative hematology ROS (+)   Anesthesia Other Findings   Reproductive/Obstetrics negative OB ROS                             Anesthesia Physical Anesthesia Plan  ASA: II  Anesthesia Plan: General   Post-op Pain Management:    Induction: Intravenous  Airway Management Planned: LMA  Additional Equipment:   Intra-op Plan:   Post-operative Plan: Extubation in OR  Informed Consent: I have reviewed the patients History and Physical, chart, labs and discussed the procedure including the risks, benefits and alternatives for the proposed anesthesia with the patient or authorized representative who has indicated his/her understanding and acceptance.   Dental advisory given  Plan Discussed with: CRNA  Anesthesia Plan Comments:         Anesthesia Quick Evaluation  

## 2015-01-31 NOTE — Anesthesia Procedure Notes (Signed)
Procedure Name: LMA Insertion Date/Time: 01/31/2015 4:16 PM Performed by: Tilda BurrowFERGUSON, JOHN V Pre-anesthesia Checklist: Patient identified Patient Re-evaluated:Patient Re-evaluated prior to inductionOxygen Delivery Method: Circle system utilized Preoxygenation: Pre-oxygenation with 100% oxygen Intubation Type: IV induction Ventilation: Mask ventilation without difficulty LMA: LMA inserted LMA Size: 3.0 Number of attempts: 1 Tube secured with: Tape Dental Injury: Teeth and Oropharynx as per pre-operative assessment

## 2015-02-01 LAB — BASIC METABOLIC PANEL
Anion gap: 6 (ref 5–15)
BUN: 11 mg/dL (ref 6–20)
CO2: 26 mmol/L (ref 22–32)
CREATININE: 0.84 mg/dL (ref 0.44–1.00)
Calcium: 8 mg/dL — ABNORMAL LOW (ref 8.9–10.3)
Chloride: 102 mmol/L (ref 101–111)
GLUCOSE: 121 mg/dL — AB (ref 70–99)
POTASSIUM: 4 mmol/L (ref 3.5–5.1)
Sodium: 134 mmol/L — ABNORMAL LOW (ref 135–145)

## 2015-02-01 LAB — CBC
HCT: 31.7 % — ABNORMAL LOW (ref 36.0–46.0)
Hemoglobin: 10.8 g/dL — ABNORMAL LOW (ref 12.0–15.0)
MCH: 30.3 pg (ref 26.0–34.0)
MCHC: 34.1 g/dL (ref 30.0–36.0)
MCV: 89 fL (ref 78.0–100.0)
PLATELETS: 304 10*3/uL (ref 150–400)
RBC: 3.56 MIL/uL — ABNORMAL LOW (ref 3.87–5.11)
RDW: 12.2 % (ref 11.5–15.5)
WBC: 9.8 10*3/uL (ref 4.0–10.5)

## 2015-02-01 MED ORDER — ACETAMINOPHEN 325 MG PO TABS
650.0000 mg | ORAL_TABLET | ORAL | Status: DC | PRN
  Administered 2015-02-01: 650 mg via ORAL
  Filled 2015-02-01: qty 2

## 2015-02-01 NOTE — Progress Notes (Signed)
1 Day Post-Op Procedure(s) (LRB): INCISION AND DRAINAGE ABSCESS (N/A)  Now with dressing change.  Subjective: Patient reports incisional pain.  But better than yesterday.  Objective: I have reviewed patient's vital signs, medications, labs and microbiology.  GI: soft, non-tender; bowel sounds normal; no masses,  no organomegaly, incision: erythematous and Much better tissue appearance than yesterday. No necrosis. No odor, fresh vascular surfaces. and no tissue oozing. Wet to dry dressing applied to the right lower drain site and to the main incision. Assessment: s/p Procedure(s): INCISION AND DRAINAGE ABSCESS (N/A): progressing well Theres no evidence of necrotizing tissues.  Plan: discontinue the Vancomycin, and the cleocin, continue the Zosyn. try to arrange [facility] wound vac tomorrow and change dressing at noon. Pharmacy aware.  LOS: 4 days    Arayla Kruschke V 02/01/2015, 10:57 AM

## 2015-02-01 NOTE — Anesthesia Postprocedure Evaluation (Signed)
  Anesthesia Post-op Note  Patient: Tara Rocha  Procedure(s) Performed: Procedure(s) (LRB): INCISION AND DRAINAGE ABSCESS (N/A)  Patient Location: PACU  Anesthesia Type: General  Level of Consciousness: awake and alert   Airway and Oxygen Therapy: Patient Spontanous Breathing  Post-op Pain: mild  Post-op Assessment: Post-op Vital signs reviewed, Patient's Cardiovascular Status Stable, Respiratory Function Stable, Patent Airway and No signs of Nausea or vomiting  Last Vitals:  Filed Vitals:   02/01/15 0346  BP: 102/70  Pulse: 68  Temp: 36.8 C  Resp: 18    Post-op Vital Signs: stable   Complications: No apparent anesthesia complications

## 2015-02-02 ENCOUNTER — Encounter (HOSPITAL_COMMUNITY): Payer: Self-pay | Admitting: Obstetrics and Gynecology

## 2015-02-02 MED ORDER — AMOXICILLIN-POT CLAVULANATE 500-125 MG PO TABS
1.0000 | ORAL_TABLET | Freq: Three times a day (TID) | ORAL | Status: DC
  Administered 2015-02-02: 500 mg via ORAL
  Filled 2015-02-02 (×3): qty 1

## 2015-02-02 MED ORDER — HYDROMORPHONE HCL 2 MG PO TABS: 1.0000 mg | ORAL_TABLET | ORAL | Status: DC | PRN

## 2015-02-02 NOTE — Care Management Note (Signed)
Case Management Note  Patient Details  Name: Tara Rocha MRN: [id number] Date of Birth: 06/21/1981                Expected Discharge Date:                  Expected Discharge Plan:  Home w Home Health Services  In-House Referral:     Discharge planning Services  CM Consult  Post Acute Care Choice:  Home Health, Durable Medical Equipment Choice offered to:  Patient  DME Arranged:  Vac DME Agency:  [facility]  HH Arranged:  RN HH Agency:  [facility]  Status of Service:  Completed, signed off    Additional Comments: CM met with patient in room in length and choice offered choice for HH and DME.  Patient did not have a preference for HH agency/ list provided so referral sent to [facility] # [phone] with Kristen Hayworth.  Dr. Ferguson, Ricky with [facility] and WOC RN on unit and VAC placed today .  Demographics, OP note, H/P faxed to Ricky at [facility].  Per Ricky patient received approval for home VAC and it was delivered  to patient's room around 3:30 today.  Plan is for patient to be dc'd home with [facility] providing RN services with 1st dressing change on Wednesday 02/04/15.  Patient verbalized understanding and phone number of [facility] #[phone] given to patient.   Gaines, Lanette Brown, RN 02/02/2015, 4:29 PM  

## 2015-02-02 NOTE — Progress Notes (Signed)
2 Days Post-Op Procedure(s) (LRB): INCISION AND DRAINAGE ABSCESS (N/A)  Subjective: Patient reports incisional pain.    Objective: I have reviewed patient's vital signs and intake and output.  General: alert, cooperative and no distress  Assessment: s/p Procedure(s): INCISION AND DRAINAGE ABSCESS (N/A): progressing well  Plan: Wound vac is planned and Dr Emelda FearFerguson will come for placement today  LOS: 5 days    Tara Rocha 02/02/2015, 7:48 AM

## 2015-02-02 NOTE — Progress Notes (Signed)
2 Days Post-Op Procedure(s) (LRB): INCISION AND DRAINAGE ABSCESS (N/A)  Subjective: Patient reports incisional pain.    Objective: I have reviewed patient's vital signs, intake and output, medications and microbiology.  GI: soft, non-tender; bowel sounds normal; no masses,  no organomegaly and incision: erythematous and significant improvement. wet to dry dressing removed, and [facility] wound vac prepared for incision, with 2 small pieces of drain sponge placed in small tunnels in cesarean section site, 5 cm depth on left, 2.5 cm depth on right. Rest of cesarean wound packed with a thin strip of black gauze. suprapubic wound at site of right JP packed with a single j-shaped piece of white gauze, and overlaid with dark gauze. An additional piece of darg gauze placed over the top of the left jp site, to allow active suction, but with out deep packing. .  Assessment: s/p Procedure(s): INCISION AND DRAINAGE ABSCESS (N/A): placed [facility] wound vac for outpt care.  Plan: Advance to PO medication Discontinue IV fluids PO augmentin now.  Probable d/c later today  LOS: 5 days    Tara Rocha V 02/02/2015, 1:14 PM

## 2015-02-02 NOTE — Progress Notes (Signed)
Pt verbalizes understanding of d/c instructions, medications, follow up appts, when to seek medical attn and belongings policy. No questions at this time. IV was removed without complications. Pt was given a copy of d/c instructions, prescription and "Recoverying From Surgery" Book. Pts husband is taking her belongings to the car and will be driving pt home. Pt escorted to main entrance accompanied by NT. Sheryn BisonGordon, Estephania Licciardi Warner

## 2015-02-02 NOTE — Consult Note (Signed)
WOC wound consult note Reason for Consult: Infectious JP drain site and tunneling present in C-Section site, healing by secondary intention.  NPWT (VAC) placement today in anticipation of discharge with HH.  [facility] is selected. MD (Dr Emelda FearFerguson) is at bedside today for dressing change.  Wound type: Surgical site.  C-Section and distal JP drain site.  Measurement: C-section site:  4 cm x 9 cm x 4 cm with tunneling present at 3:00 and 9:00 in wound bed. 3:00 tunnel 5 cm in depth 9:00 tunnel 2.5 cm in depth Distal JP drain site right side of mons pubis:  3 cm x 2 cm x 3 cm Small resolving incision site left side mons pubis: 1 cm linear site. No depth appreciated.  Wound bed: 100% beefy red.  Some purulence noted from 9:00 tunnel at dressing change.   Drainage (amount, consistency, odor) Moderate, serosanguinous drainage.  No odor.   Periwound: Intact.  Pubic hair is not trimmed today, but may need dry powder shave at next dressing change.  Dressing procedure/placement/frequency: Cleanse wounds to abdomen and JP drain site with NS and pat gently dry.  Gently fill tunneling at C-Section site (9 and 3 o'clock) with white foam.  Fill remaining C-Section wound with black foam.  Protect perineal skin with adhesive drape.  Gently fill JP drain site with white foam.  Bridge 2 wounds together with black foam.  Small piece of black foam bridged from left side mons pubis incision site to c-section wound.  Seal is immediately achieved at 125mmHg.    Patient is hopeful for discharge this evening.  HH to follow.  Will not follow at this time.  Please re-consult if needed.  Maple HudsonKaren Kerryn Tennant RN BSN CWON Pager [phone]

## 2015-02-02 NOTE — Discharge Instructions (Signed)
Home health wound care Wednesday and Friday of this week.

## 2015-02-04 LAB — WOUND CULTURE

## 2015-02-06 LAB — ANAEROBIC CULTURE

## 2015-02-19 ENCOUNTER — Encounter: Payer: Self-pay | Admitting: Obstetrics & Gynecology

## 2015-02-19 ENCOUNTER — Telehealth: Payer: Self-pay | Admitting: Obstetrics and Gynecology

## 2015-02-19 ENCOUNTER — Ambulatory Visit (INDEPENDENT_AMBULATORY_CARE_PROVIDER_SITE_OTHER): Payer: BLUE CROSS/BLUE SHIELD | Admitting: Obstetrics & Gynecology

## 2015-02-19 VITALS — BP 94/60 | HR 78 | Temp 97.2°F | Resp 16 | Ht 66.0 in | Wt 208.0 lb

## 2015-02-19 DIAGNOSIS — Z9889 Other specified postprocedural states: Secondary | ICD-10-CM

## 2015-02-19 DIAGNOSIS — L7682 Other postprocedural complications of skin and subcutaneous tissue: Secondary | ICD-10-CM | POA: Diagnosis not present

## 2015-02-19 DIAGNOSIS — Z5189 Encounter for other specified aftercare: Secondary | ICD-10-CM

## 2015-02-19 NOTE — Progress Notes (Signed)
   Subjective:    Patient ID: Tara Rocha, female    DOB: Aug 06, 1981, 34 y.o.   MRN: [id number]  HPI  34 yo MW lady here for removal of her wound vac. Her RLTCS and tummy tuck post op course was complicated by a staph infection requiring readmission for IV abx and wound debridement.  She is having great difficulty with breast feeding, feeling frustrated. She had planned to breast feed for 6 weeks.  Review of Systems     Objective:   Physical Exam [id number] Breathing and ambulating normally Incision healing well, no evidence of infection noted after removing her wound vac I did a wet to dry dressing      Assessment & Plan:  RTC 1 week/prn sooner

## 2015-02-19 NOTE — Telephone Encounter (Signed)
Pt states that she got in touch with her OB/GYN and they gave her an appointment today.

## 2015-02-24 ENCOUNTER — Telehealth: Payer: Self-pay | Admitting: Obstetrics and Gynecology

## 2015-02-24 NOTE — Telephone Encounter (Signed)
I spoke with the Home Health Nurse and she stated that the pt's orders needed to be changed from wound vac to wet to dry dressings daily. Nurse stated that the pt has pain with the wound vac and refused to allow nurse to apply again. Pt's wound has healed well per nurse and the pt does not have anymore home visits after this week. Pt does have follow up appointments with her regular OB this week. Nurse stated that she could write the order and fax it to the office and get Dr. Emelda FearFerguson to sign the order.

## 2015-02-25 NOTE — Telephone Encounter (Signed)
Patient called , and offer of appt to check incision made. [facility]

## 2015-02-26 ENCOUNTER — Encounter: Payer: Self-pay | Admitting: Obstetrics & Gynecology

## 2015-02-26 ENCOUNTER — Ambulatory Visit (INDEPENDENT_AMBULATORY_CARE_PROVIDER_SITE_OTHER): Payer: BLUE CROSS/BLUE SHIELD | Admitting: Obstetrics & Gynecology

## 2015-02-26 VITALS — BP 111/74 | HR 69 | Resp 16 | Ht 66.0 in

## 2015-02-26 DIAGNOSIS — Z5189 Encounter for other specified aftercare: Secondary | ICD-10-CM

## 2015-02-26 DIAGNOSIS — Z09 Encounter for follow-up examination after completed treatment for conditions other than malignant neoplasm: Secondary | ICD-10-CM

## 2015-02-26 DIAGNOSIS — Z9889 Other specified postprocedural states: Secondary | ICD-10-CM

## 2015-02-26 NOTE — Progress Notes (Signed)
   Subjective:    Patient ID: Tara Rocha, female    DOB: [birth year], 34 y.o.   MRN: [id number]  HPI 34 yo lady is here for an incision check. She is doing wet to dry dressing changes. She has no complaints.   Review of Systems     Objective:   Physical Exam  Incision healing great. There is no defect to pack, the edges are healing nicely by secondary intention.      Assessment & Plan:  Incision healing well RTC 1 week/prn sooner

## 2015-03-02 ENCOUNTER — Telehealth: Payer: Self-pay | Admitting: Obstetrics and Gynecology

## 2015-03-03 ENCOUNTER — Ambulatory Visit (INDEPENDENT_AMBULATORY_CARE_PROVIDER_SITE_OTHER): Payer: BLUE CROSS/BLUE SHIELD | Admitting: Obstetrics and Gynecology

## 2015-03-03 ENCOUNTER — Encounter: Payer: Self-pay | Admitting: Obstetrics and Gynecology

## 2015-03-03 VITALS — BP 120/78

## 2015-03-03 DIAGNOSIS — Z9889 Other specified postprocedural states: Secondary | ICD-10-CM

## 2015-03-03 DIAGNOSIS — T814XXD Infection following a procedure, subsequent encounter: Secondary | ICD-10-CM

## 2015-03-03 DIAGNOSIS — IMO0001 Reserved for inherently not codable concepts without codable children: Secondary | ICD-10-CM

## 2015-03-10 DIAGNOSIS — L929 Granulomatous disorder of the skin and subcutaneous tissue, unspecified: Secondary | ICD-10-CM | POA: Diagnosis not present

## 2015-03-10 DIAGNOSIS — T814XXD Infection following a procedure, subsequent encounter: Secondary | ICD-10-CM

## 2015-03-10 NOTE — Telephone Encounter (Signed)
Patient scheduled for appt this office for 03/03/15

## 2015-03-10 NOTE — Progress Notes (Signed)
Patient ID: Tara Rocha, female   DOB: [birth year], 34 y.o.   MRN: [id number] Subjective:     Tara Rocha is a 34 y.o. female who presents to the clinic 5 weeks status post cesarean section with wide excision of cicatrix, complicated by a postop wound infection origiinating in the right JP drain site, requring readmission, for IV antibiotics and wound exploration.   Marland Kitchen Postop wound Vac has resulted in very good closure of the defects, and there is only a small area 4 mm wide of healing by secondary intention where the cesarean scar was reopened for wound exploration. She is using a dry dressing over it, and some pulling off of the reepithelialization is happening with wound care. Additionally there is a small 6 mm area of granulation tissue on the right side of the cesarean wound that is slow to heal, but quite superficial, we'll treat with steristrips to stabilize the tissues , and allow gradual healing to continue. Diet:       regular without difficulty. Bowel function is: normal. Pain:     The patient is not having any pain. 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  Interestingly, the patient once had a insect( or spider) bite on her left thigh, that resulted in infection and require wound packing, with eventual full recovery.  Review of Systems Pertinent items are noted in HPI. she is afebrile.   Objective:    BP 120/78 mmHg General:  alert, cooperative and no distress  Abdomen: soft, bowel sounds active, non-tender  Incision:   no dehiscence, incision well approximated there is a 10 cm x 4 mm strip of the surface that is healing by secondary intention.which will be treated in the future with topical neosporin , steristrips spaced out , to reduce mobility, and telfa dressings instead of dry gauze.       Pelvic: not required    Assessment:    Postoperative course complicated by postop wound infection, now recovering nicely topical tx reveiwed and adjusted. Operative findings again  reviewed. Pathology report discussed.    Plan:    1. Continue any current medications. 2. Wound care discussed. 3. Activity restrictions: none 4. Anticipated return to work: not applicable. 5. Follow up: prn .

## 2015-03-12 NOTE — Discharge Summary (Signed)
Gynecology Physician Postoperative Discharge Summary  Patient ID: Tara Rocha MRN: [id number] DOB/AGE: March 31, 1981 34 y.o.  Admit Date: 01/28/2015 Discharge Date: 02/02/15  Preoperative Diagnoses: Admit diagnosis Postoperative wound infection at JP drain site.  Procedures: Procedure(s) (LRB): INCISION AND DRAINAGE ABSCESS (N/A)  Significant Labs: CBC Latest Ref Rng 02/01/2015 01/30/2015 01/28/2015  WBC 4.0 - 10.5 K/uL 9.8 9.8 17.1(H)  Hemoglobin 12.0 - 15.0 g/dL 10.8(L) 11.6(L) 11.6(L)  Hematocrit 36.0 - 46.0 % 31.7(L) 34.3(L) 33.7(L)  Platelets 150 - 400 K/uL 304 280 238    Hospital Course:  Tara Rocha is a 34 y.o. [phone]  admitted for A postoperative wound infection. She underwent a repeat cesarean section and tubal ligation by Dr Penne Lash and a wide excision of cicatrix by me on 01/19/15. She was had a JP subcutaneous drain x 2 in the incision site with suprapubic exit sites. Postoperative course was considered normal, and patient was dicharged on postop day 3 with jp drains in place with care explained to patient with plans for drain removal in office day 7.  Postop day 1 HGB was 11.8, WBC 12.5, and postop Tmax was 98.5. She was seen in the MAU on postop day 6, 01/25/15, had significant soreness around the jp drain on the right, and had wbc 8,500, Hgb 11.4. Local anesthesia was injected around the drain and it was not removed. When seen in my office 5/3, postop day 8, there was obvious infection, and JP drain removed and oral antibiotics begun, Augmentin and Flagyl. The following morning, there was continued soreness, and she was admitted for IV antibiotics and further assessment. Culture of the drain from 5/3 showed heavy growth staph aureus. On 5/5 she had placement of a PICO wound vac to attempt improved evacuation of wound drainage.  The PICO placement was ineffective , being placed more over the central cicatrix excision and not effectively draining the JP drain, site, and on 5/7 it was  obvious that wound abscess had developed, and pt was taken to the OR for exploration , which involved exploring the JP drain site and also the central portion of the large wide excision of cicatrix.  Abscess culture again grew Staph aureus. She was continued on Zosyn, and wet-dry dressings showed significant improvement, Wet-to dry dressing followed, and pt received IV antibiotics.and she was consulted with [facility] wound vac on 02/02/15 , and sent home for wound vac care at home.    Discharge Exam: Blood pressure 111/60, pulse 77, temperature 98 F (36.7 C), temperature source Oral, resp. rate 18, height  (1.651 m), weight 210 lb (95.255 kg), last menstrual period 01/22/2015, SpO2 100 %, currently breastfeeding. General appearance: alert and no distress  Resp: clear to auscultation bilaterally  Cardio: regular rate and rhythm  GI: soft, non-tender; bowel sounds normal; no masses, no organomegaly.  Incision: wound vac in place, [facility] brand. Pelvic: scant blood on pad  Extremities: extremities normal, atraumatic, no cyanosis or edema and Homans sign is negative, no sign of DVT  Discharged Condition: Stable  Disposition: 06-Home-Health Care Svc with [facility] wound vac     Medication List    STOP taking these medications        amoxicillin-clavulanate 875-125 MG per tablet  Commonly known as:  AUGMENTIN     clindamycin 300 MG capsule  Commonly known as:  CLEOCIN     HYDROmorphone 2 MG tablet  Commonly known as:  DILAUDID      TAKE these medications  ibuprofen 600 MG tablet  Commonly known as:  ADVIL,MOTRIN  Take 1 tablet (600 mg total) by mouth every 6 (six) hours.     metoCLOPramide 5 MG tablet  Commonly known as:  REGLAN  Take 1 tablet (5 mg total) by mouth 3 (three) times daily before meals.     OVER THE COUNTER MEDICATION  Take 1 tablet by mouth daily. GAS X     prenatal multivitamin Tabs tablet  Take 1 tablet by mouth daily at 12 noon.           Follow-up  Information    Follow up In 2 weeks.   Why:  [facility]      Signed:

## 2015-03-20 ENCOUNTER — Ambulatory Visit (INDEPENDENT_AMBULATORY_CARE_PROVIDER_SITE_OTHER): Payer: BLUE CROSS/BLUE SHIELD | Admitting: Obstetrics & Gynecology

## 2015-03-20 ENCOUNTER — Encounter: Payer: Self-pay | Admitting: Obstetrics & Gynecology

## 2015-03-20 MED ORDER — HYDROCHLOROTHIAZIDE 12.5 MG PO TABS: 12.5000 mg | ORAL_TABLET | Freq: Every day | ORAL | Status: DC

## 2015-03-20 NOTE — Progress Notes (Signed)
Patient ID: Tara Rocha, female   DOB: 1981/09/02, 34 y.o.   MRN: [id number] Post Partum Exam  Tara Rocha is a 34 y.o. female who presents for a postpartum visit. She is 8 weeks postpartum following a low cervical transverse Cesarean section. I have fully reviewed the prenatal and intrapartum course. The delivery was at 39 gestational weeks. Outcome: repeat cesarean section, low transverse incision and cesarean indication: previous uterine incision classical. Anesthesia: epidural. Postpartum course has been eventful.  Patient had a swound infection and needed  I 7 D.  Healing well now.  Baby's course has been unremarkable. Baby is feeding by bottle - Enfamil AR. Bleeding no bleeding. Bowel function is normal. Bladder function is normal. Patient is not sexually active. Contraception method is tubal ligation. Postpartum depression screening: negative.  The following portions of the patient's history were reviewed and updated as appropriate: allergies, current medications, past family history, past medical history, past social history, past surgical history and problem list.  Review of Systems A comprehensive review of systems was negative.   Objective:    BP 116/78 mmHg  Pulse 78  Resp 16  Ht 5\' 5"  (1.651 m)  Wt 211 lb (95.709 kg)  BMI 35.11 kg/m2  Breastfeeding? Yes  General:  alert, cooperative and no distress   Breasts:  negative  Lungs: normal effort  Heart:  no exam  Abdomen: soft, non-tender; bowel sounds normal; no masses,  no organomegaly   Vulva:  not evaluated  Vagina: not evaluated  Cervix:  not evaluated  Corpus: not examined  Adnexa:  not evaluated  Rectal Exam: Not performed.        Extremities:  3+ edema, no evidence of DVT  Assessment:    Nml postpartum exam. Pap smear not done at today's visit.   Plan:    1. Contraception: tubal ligation 2. HCTZ 12.5 mg for one week to help with fluid retention.  Patient to monitor for signs of hypotension while taking the  HCTZ.  If she becomes dizzy or lightheaded, then patient to stop medication.  Pateint should also not start exercising on same day as first dose. 3. Follow up in: 4 months or as needed. Pap smear needed

## 2015-05-08 ENCOUNTER — Other Ambulatory Visit: Payer: BLUE CROSS/BLUE SHIELD

## 2015-05-11 ENCOUNTER — Other Ambulatory Visit: Payer: BLUE CROSS/BLUE SHIELD

## 2015-05-20 ENCOUNTER — Other Ambulatory Visit (INDEPENDENT_AMBULATORY_CARE_PROVIDER_SITE_OTHER): Payer: BLUE CROSS/BLUE SHIELD

## 2015-05-20 DIAGNOSIS — R5383 Other fatigue: Secondary | ICD-10-CM

## 2015-05-20 NOTE — Progress Notes (Signed)
Pt here with c/o's fatigue and hair loss.  Per Dr Marice Potter needs TSH and CBC

## 2015-05-21 ENCOUNTER — Telehealth: Payer: Self-pay | Admitting: *Deleted

## 2015-05-21 LAB — CBC
HCT: 42.7 % (ref 36.0–46.0)
Hemoglobin: 14.5 g/dL (ref 12.0–15.0)
MCH: 29.3 pg (ref 26.0–34.0)
MCHC: 34 g/dL (ref 30.0–36.0)
MCV: 86.3 fL (ref 78.0–100.0)
MPV: 8.6 fL (ref 8.6–12.4)
Platelets: 239 10*3/uL (ref 150–400)
RBC: 4.95 MIL/uL (ref 3.87–5.11)
RDW: 13.3 % (ref 11.5–15.5)
WBC: 4.3 10*3/uL (ref 4.0–10.5)

## 2015-05-21 LAB — TSH: TSH: 1.256 u[IU]/mL (ref 0.350–4.500)

## 2015-05-21 NOTE — Telephone Encounter (Signed)
Pt notified of normal labs.  Suggest Dermatology for her hair loss.

## 2015-07-20 ENCOUNTER — Ambulatory Visit (INDEPENDENT_AMBULATORY_CARE_PROVIDER_SITE_OTHER): Payer: BLUE CROSS/BLUE SHIELD | Admitting: Podiatry

## 2015-07-20 ENCOUNTER — Encounter: Payer: Self-pay | Admitting: Podiatry

## 2015-07-20 VITALS — BP 122/77 | HR 55 | Ht 65.0 in | Wt 198.0 lb

## 2015-07-20 DIAGNOSIS — S90921A Unspecified superficial injury of right foot, initial encounter: Secondary | ICD-10-CM

## 2015-07-20 DIAGNOSIS — M79671 Pain in right foot: Secondary | ICD-10-CM | POA: Insufficient documentation

## 2015-07-20 NOTE — Progress Notes (Signed)
SUBJECTIVE: 34 y.o. year old female presents stating that she dropped a butter knife on top of the right foot a week ago. Since then there has been a sharp pain over the base of the first metatarsal bone and runs down to the top of the great toe.   REVIEW OF SYSTEMS: Pertinent items noted in HPI and remainder of comprehensive ROS otherwise negative.  OBJECTIVE: DERMATOLOGIC EXAMINATION: No abnormal findngs. No open lesions or abnormal skin lesion.  No palpable masses or nodules noted at affected area. VASCULAR EXAMINATION OF LOWER LIMBS: Pedal pulses: All pedal pulses are palpable with normal pulsation.  No edema or erythema noted. NEUROLOGIC EXAMINATION OF THE LOWER LIMBS: No abnormal neurologic findings. Normal sensation over the affected area.  MUSCULOSKELETAL EXAMINATION: Noted of excess first ray sagittal plane motion at the base bilateral. Pain over the first metatarsal base upon dorsi flexion and plantar flexion of the first MPJ right.   ASSESSMENT: Blunt crushing injury over the first MCJ dorsum right foot with butter knife incident.   PLAN: Reviewed findings. Suggested light compression with elastic stockinet for the next 3 weeks during ambulation to prevent excess first ray motion and decrease scar formation.  Return if pain increase.

## 2015-07-20 NOTE — Patient Instructions (Signed)
Seen for right foot pain following a butter knife incident. Will keep it compressed with elastic stockinet for the next 3 weeks.  Return if pain continues.

## 2016-02-10 ENCOUNTER — Ambulatory Visit: Payer: BLUE CROSS/BLUE SHIELD | Admitting: Obstetrics & Gynecology

## 2016-02-23 ENCOUNTER — Encounter: Payer: Self-pay | Admitting: Obstetrics & Gynecology

## 2016-02-23 ENCOUNTER — Ambulatory Visit (INDEPENDENT_AMBULATORY_CARE_PROVIDER_SITE_OTHER): Payer: BLUE CROSS/BLUE SHIELD | Admitting: Obstetrics & Gynecology

## 2016-02-23 VITALS — BP 116/72 | HR 60 | Resp 16 | Ht 65.0 in | Wt 189.0 lb

## 2016-02-23 DIAGNOSIS — Z01419 Encounter for gynecological examination (general) (routine) without abnormal findings: Secondary | ICD-10-CM | POA: Diagnosis not present

## 2016-02-23 DIAGNOSIS — Z124 Encounter for screening for malignant neoplasm of cervix: Secondary | ICD-10-CM

## 2016-02-23 DIAGNOSIS — M25511 Pain in right shoulder: Secondary | ICD-10-CM | POA: Diagnosis not present

## 2016-02-23 DIAGNOSIS — R5383 Other fatigue: Secondary | ICD-10-CM

## 2016-02-23 DIAGNOSIS — Z1151 Encounter for screening for human papillomavirus (HPV): Secondary | ICD-10-CM | POA: Diagnosis not present

## 2016-02-23 NOTE — Progress Notes (Signed)
  Subjective:     Tara Rocha is a 35 y.o. female here for a routine exam.  Current complaints: weight loss not fast enough.  Pt also hsa menses q 24 days and are heavy.  The first 3 days she uses 4 super tampons a day.  Does not soak through, does not use pads.  Not interested in hormonal contraception to manage flow.  Does have right shoulder pain and wants to see sports med.     Gynecologic History Patient's last menstrual period was 02/07/2016. Contraception: tubal ligation Last Pap: 2013. Results were: normal   Obstetric History OB History  Gravida Para Term Preterm AB SAB TAB Ectopic Multiple Living  4 3 3  1 1    0 3    # Outcome Date GA Lbr Len/2nd Weight Sex Delivery Anes PTL Lv  4 Term 01/19/15 2739w1d  7 lb 7.2 oz (3.379 kg) M CS-LTranv Spinal,EPI  Y  3 Term 03/18/11 5125w0d  7 lb 4 oz (3.289 kg) F CS-Classical EPI  Y     Comments: poly hydramnios  2 SAB 02/24/10 5956w0d         1 Term 04/27/07 4447w0d 24:00 9 lb (4.082 kg) F CS-Classical EPI  Y       The following portions of the patient's history were reviewed and updated as appropriate: allergies, current medications, past family history, past medical history, past social history, past surgical history and problem list.  Review of Systems Pertinent items noted in HPI and remainder of comprehensive ROS otherwise negative.    Objective:      Filed Vitals:   02/23/16 1405  BP: 116/72  Pulse: 60  Resp: 16  Height: 5\' 5"  (1.651 m)  Weight: 189 lb (85.73 kg)   Vitals:  WNL General appearance: alert, cooperative and no distress  HEENT: Normocephalic, without obvious abnormality, atraumatic Eyes: negative Throat: lips, mucosa, and tongue normal; teeth and gums normal  Respiratory: Clear to auscultation bilaterally  CV: Regular rate and rhythm  Breasts:  Normal appearance, no masses or tenderness, no nipple retraction or dimpling  GI: Soft, non-tender; bowel sounds normal; no masses,  no organomegaly  GU: External  Genitalia:  Tanner V, no lesion Urethra:  No prolapse   Vagina: Pink, normal rugae, no blood or discharge  Cervix: No CMT, no lesion  Uterus:  Normal size and contour, non tender  Adnexa: Normal, no masses, non tender  Musculoskeletal: No edema, redness or tenderness in the calves or thighs  Skin: No lesions or rash  Lymphatic: Axillary adenopathy: none    Psychiatric: Normal mood and behavior          Assessment:    Healthy female exam.   Nml menstrual cycle Pt reassured that 30 pounds in 1 year as well as decreasing inches with weight training is a great result.   Plan:    Education reviewed: skin cancer screening. Contraception: tubal ligation. Follow up in: 1 year. Sports med referral    Check TSH, CBC, and Vit D levels

## 2016-02-24 LAB — CBC
HCT: 41.2 % (ref 35.0–45.0)
Hemoglobin: 13.6 g/dL (ref 11.7–15.5)
MCH: 30.1 pg (ref 27.0–33.0)
MCHC: 33 g/dL (ref 32.0–36.0)
MCV: 91.2 fL (ref 80.0–100.0)
MPV: 8.7 fL (ref 7.5–12.5)
Platelets: 297 10*3/uL (ref 140–400)
RBC: 4.52 MIL/uL (ref 3.80–5.10)
RDW: 12.5 % (ref 11.0–15.0)
WBC: 9.1 10*3/uL (ref 3.8–10.8)

## 2016-02-24 LAB — TSH: TSH: 2.15 mIU/L

## 2016-02-24 LAB — VITAMIN D 25 HYDROXY (VIT D DEFICIENCY, FRACTURES): Vit D, 25-Hydroxy: 27 ng/mL — ABNORMAL LOW (ref 30–100)

## 2016-02-26 LAB — CYTOLOGY - PAP

## 2016-02-28 ENCOUNTER — Encounter: Payer: Self-pay | Admitting: Obstetrics & Gynecology

## 2016-02-28 ENCOUNTER — Other Ambulatory Visit: Payer: Self-pay | Admitting: Obstetrics & Gynecology

## 2016-02-28 DIAGNOSIS — E559 Vitamin D deficiency, unspecified: Secondary | ICD-10-CM | POA: Insufficient documentation

## 2016-02-28 MED ORDER — ERGOCALCIFEROL 1.25 MG (50000 UT) PO CAPS: ORAL_CAPSULE | ORAL | Status: DC

## 2016-03-01 ENCOUNTER — Telehealth: Payer: Self-pay | Admitting: *Deleted

## 2016-03-01 NOTE — Telephone Encounter (Signed)
-----   Message from Lesly DukesKelly H Leggett, MD sent at 02/28/2016  6:42 PM EDT ----- Vit d ordered and rpt level in 8 weeks.

## 2016-03-01 NOTE — Telephone Encounter (Signed)
Pt notified of low vitamin D level and has RX sent to her pharmacy.  She will return in 8 weeks for rpt Vitamin D level

## 2016-05-23 IMAGING — US US ABDOMEN LIMITED
1 series · 10 of 10 positions shown · non-contrast
Comparison: 01/28/2015 CT.

CLINICAL DATA: C-section 01/19/2015.  Abscess.  Fluid collection.

EXAM:
LIMITED ABDOMINAL ULTRASOUND

[Series 1: us abdomen limited · 10 acquisitions, 10 frames shown]
[im 1/10]
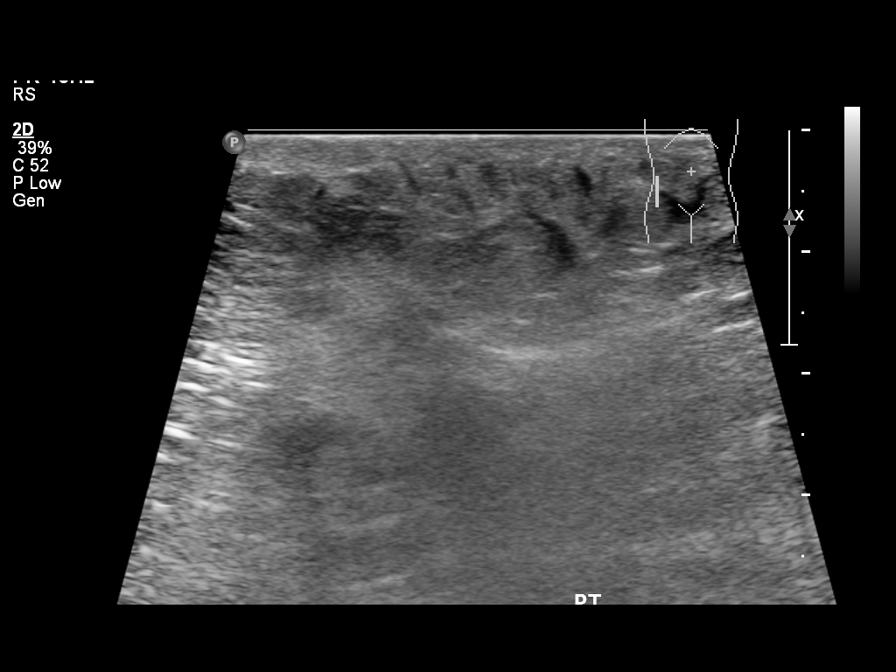
[im 2/10]
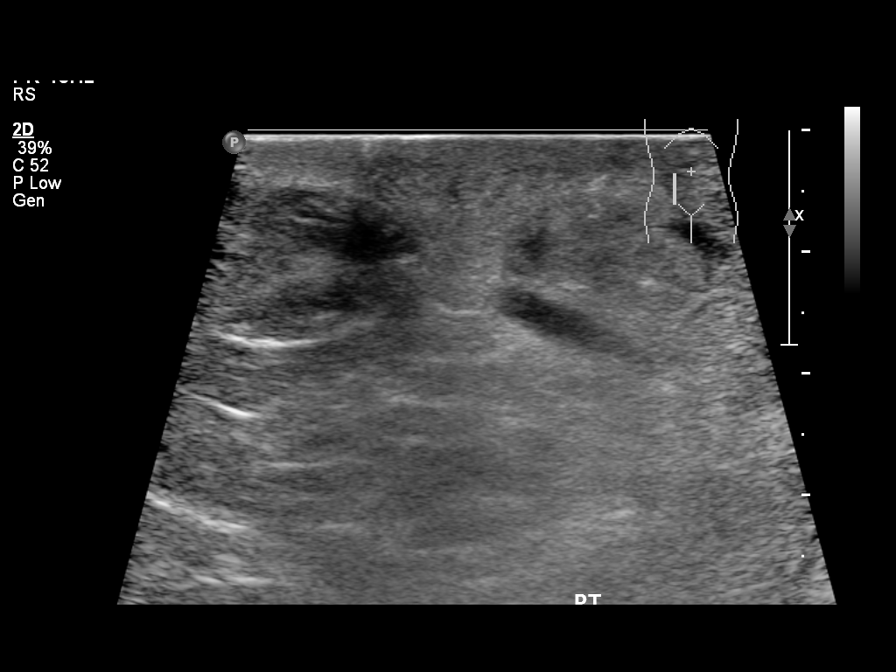
[im 3/10]
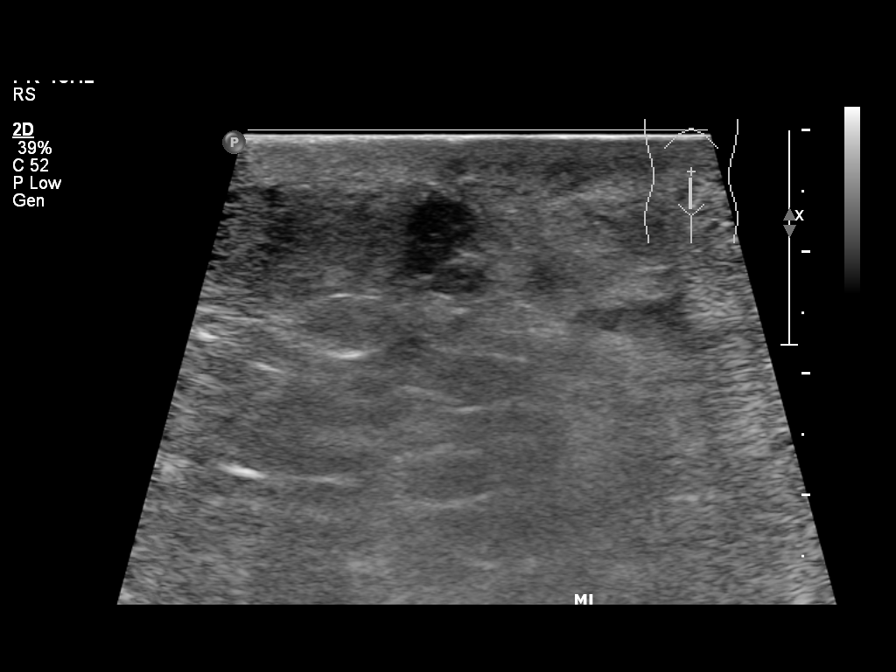
[im 4/10]
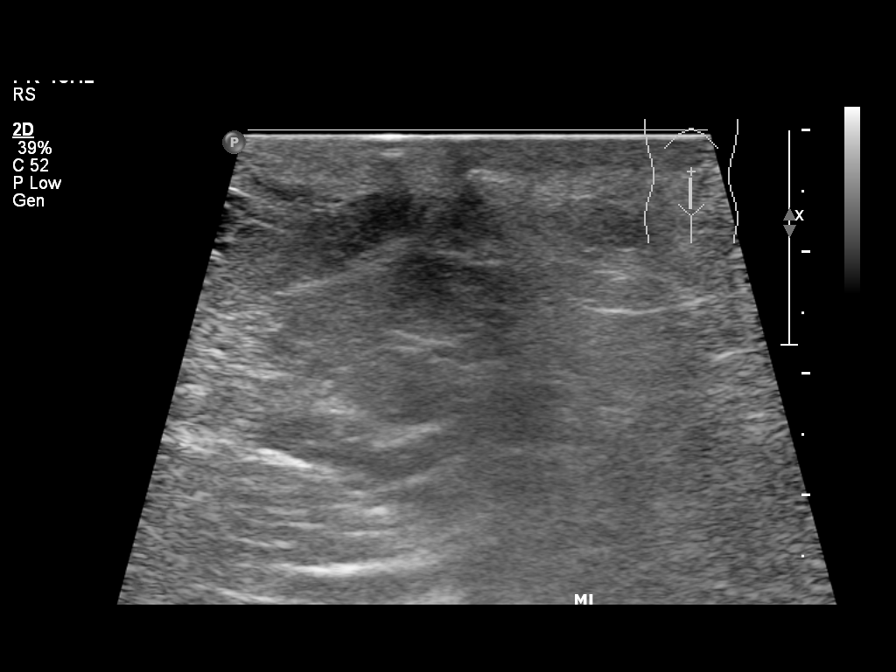
[im 5/10]
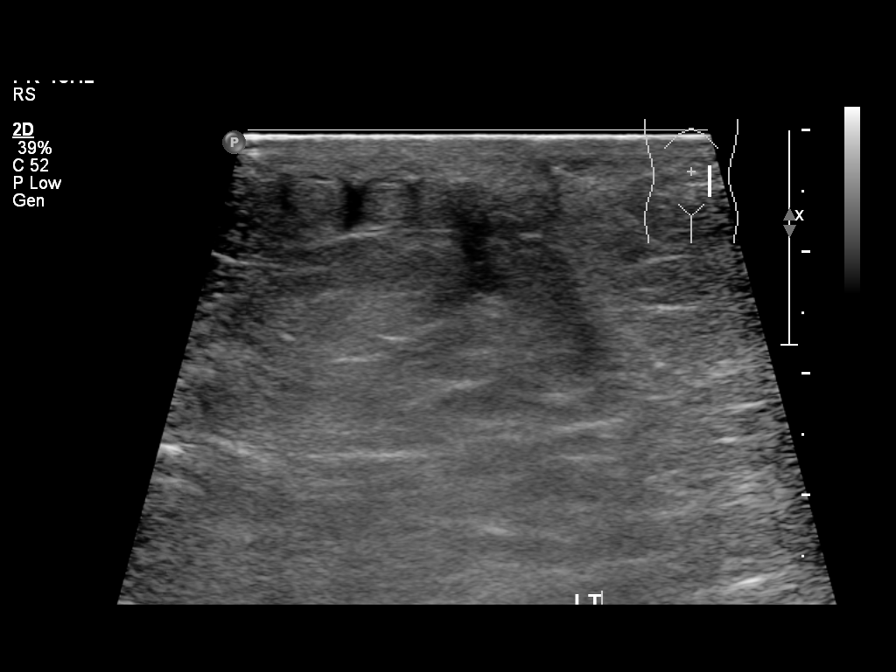
[im 6/10]
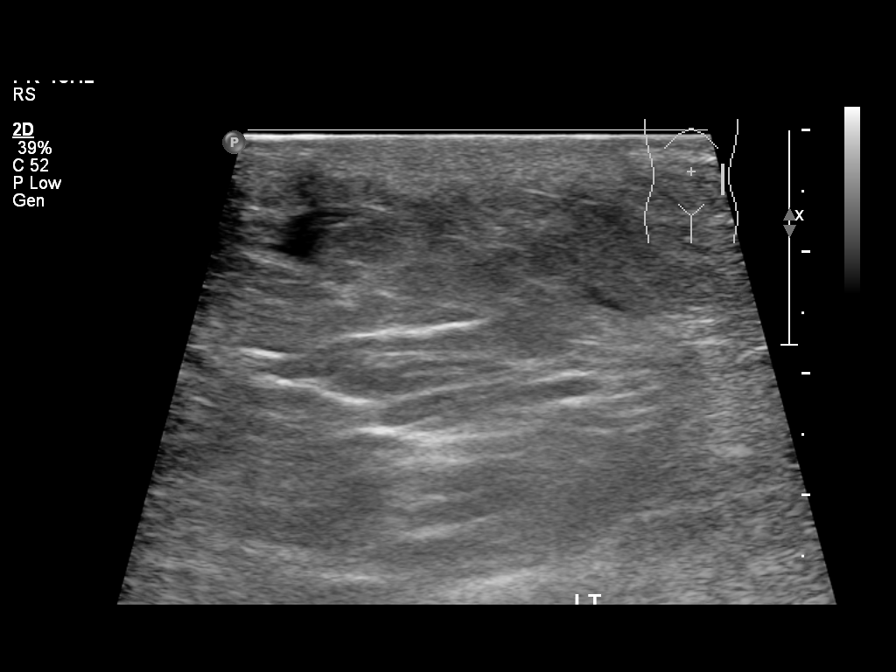
[im 7/10]
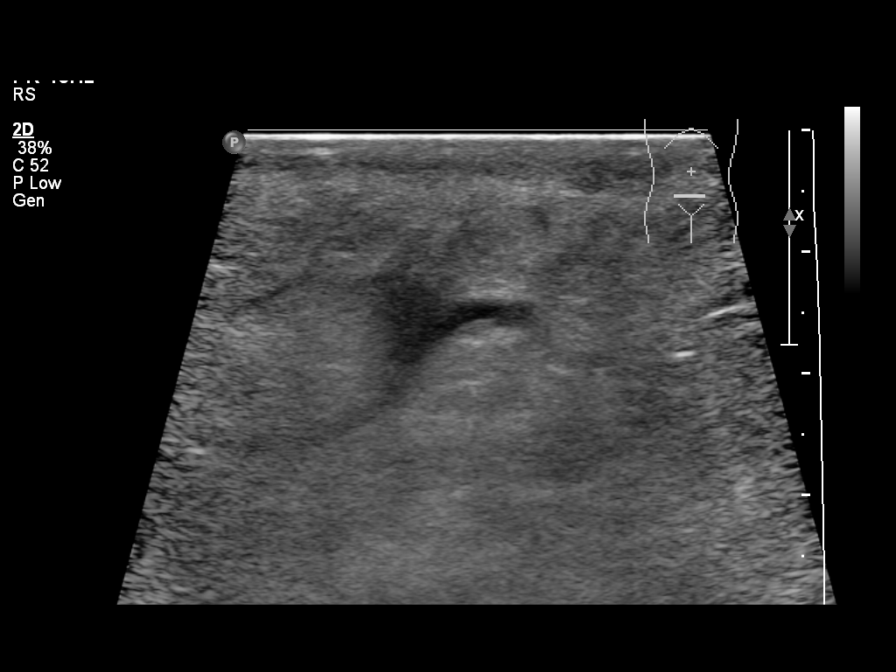
[im 8/10]
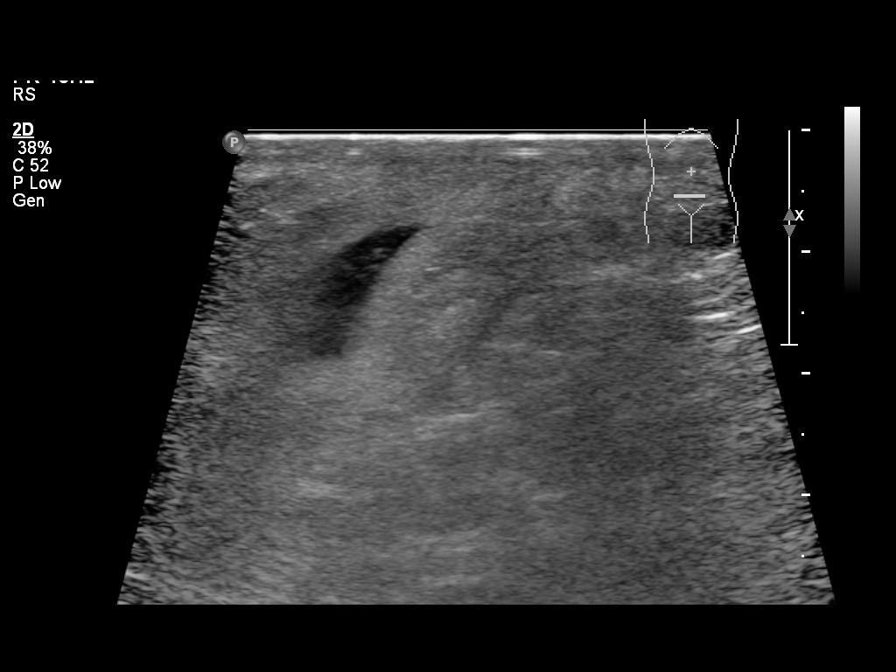
[im 9/10]
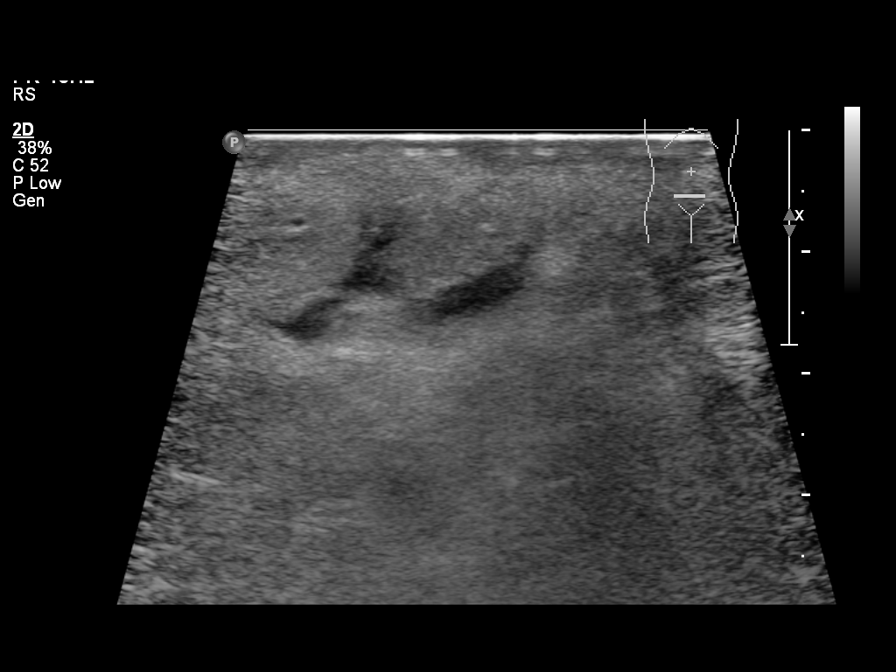
[im 10/10]
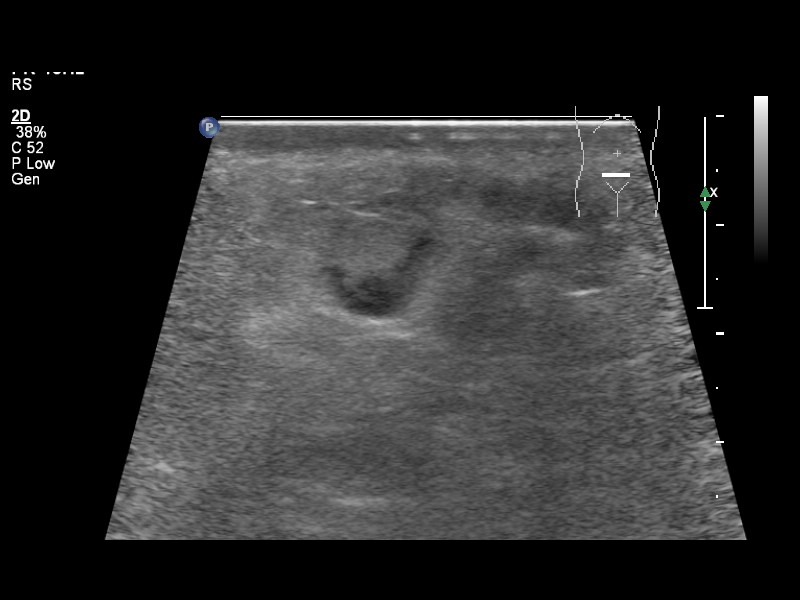

[10 of 10 positions shown; findings below may reference images not displayed]

FINDINGS: No well defined abscess is identified. Areas of phlegmon are present
and small pockets of fluid that are expressible with transducer
pressure. These small areas of fluid drained to the incision wound.
IMPRESSION: Superficial soft tissue phlegmon with small draining irregular
pockets of fluid in the subcutaneous fat near the incision. No
drainable abscess.

## 2016-05-26 ENCOUNTER — Telehealth: Payer: Self-pay | Admitting: *Deleted

## 2016-05-26 MED ORDER — MISC. DEVICES MISC: 1.0000 | Freq: Every morning | 0 refills | Status: DC

## 2016-05-26 NOTE — Telephone Encounter (Signed)
Pt called requesting a RX for her compression thigh highs and full panty compressions due to her lymphedema

## 2017-11-01 ENCOUNTER — Encounter: Payer: Self-pay | Admitting: Obstetrics & Gynecology

## 2017-11-01 ENCOUNTER — Ambulatory Visit (INDEPENDENT_AMBULATORY_CARE_PROVIDER_SITE_OTHER): Payer: 59 | Admitting: Obstetrics & Gynecology

## 2017-11-01 VITALS — BP 107/67 | HR 63 | Ht 65.0 in | Wt 188.0 lb

## 2017-11-01 DIAGNOSIS — Z01419 Encounter for gynecological examination (general) (routine) without abnormal findings: Secondary | ICD-10-CM

## 2017-11-01 MED ORDER — DULOXETINE HCL 30 MG PO CPEP: 30.0000 mg | ORAL_CAPSULE | Freq: Every day | ORAL | 3 refills | Status: DC

## 2017-11-01 NOTE — Progress Notes (Signed)
Pt c/o lack of sleep

## 2017-11-02 ENCOUNTER — Telehealth: Payer: Self-pay

## 2017-11-02 LAB — COMPREHENSIVE METABOLIC PANEL
AG Ratio: 2.2 (calc) (ref 1.0–2.5)
ALBUMIN MSPROF: 4.3 g/dL (ref 3.6–5.1)
ALT: 9 U/L (ref 6–29)
AST: 16 U/L (ref 10–30)
Alkaline phosphatase (APISO): 37 U/L (ref 33–115)
BILIRUBIN TOTAL: 0.5 mg/dL (ref 0.2–1.2)
BUN: 12 mg/dL (ref 7–25)
CALCIUM: 8.9 mg/dL (ref 8.6–10.2)
CHLORIDE: 103 mmol/L (ref 98–110)
CO2: 27 mmol/L (ref 20–32)
Creat: 0.86 mg/dL (ref 0.50–1.10)
GLOBULIN: 2 g/dL (ref 1.9–3.7)
Glucose, Bld: 91 mg/dL (ref 65–99)
POTASSIUM: 4.1 mmol/L (ref 3.5–5.3)
SODIUM: 139 mmol/L (ref 135–146)
TOTAL PROTEIN: 6.3 g/dL (ref 6.1–8.1)

## 2017-11-02 LAB — CBC
HEMATOCRIT: 39.3 % (ref 35.0–45.0)
HEMOGLOBIN: 13.4 g/dL (ref 11.7–15.5)
MCH: 30.5 pg (ref 27.0–33.0)
MCHC: 34.1 g/dL (ref 32.0–36.0)
MCV: 89.3 fL (ref 80.0–100.0)
MPV: 9.4 fL (ref 7.5–12.5)
Platelets: 257 10*3/uL (ref 140–400)
RBC: 4.4 10*6/uL (ref 3.80–5.10)
RDW: 11.8 % (ref 11.0–15.0)
WBC: 3.9 10*3/uL (ref 3.8–10.8)

## 2017-11-02 LAB — TSH: TSH: 1.54 mIU/L

## 2017-11-02 LAB — VITAMIN D 25 HYDROXY (VIT D DEFICIENCY, FRACTURES): Vit D, 25-Hydroxy: 30 ng/mL (ref 30–100)

## 2017-11-02 NOTE — Telephone Encounter (Signed)
Spoke with pt and she is aware of normal lab results

## 2017-11-02 NOTE — Progress Notes (Signed)
Subjective:     Tara Rocha is a 37 y.o. female here for a routine exam.  Current complaints: having problems staying asleep.  Wakes up with nightmares sometimes.  She is worried about managing middle child.  She is sometimes easily frustrated with her.  GAD=9 (moderate)  Pt also frustrated with weight loss plateau.  She counts calories, eats low card, does intermittent fasting and does cross fit.     Gynecologic History Patient's last menstrual period was 10/20/2017. Contraception: tubal ligation Last Pap: 01/2016. Results were: normal  Obstetric History OB History  Gravida Para Term Preterm AB Living  4 3 3   1 3   SAB TAB Ectopic Multiple Live Births  1     0 3    # Outcome Date GA Lbr Len/2nd Weight Sex Delivery Anes PTL Lv  4 Term 01/19/15 4119w1d  7 lb 7.2 oz (3.379 kg) M CS-LTranv Spinal, EPI  LIV  3 Term 03/18/11 6910w0d  7 lb 4 oz (3.289 kg) F CS-Classical EPI  LIV     Birth Comments: poly hydramnios  2 SAB 02/24/10 4122w0d         1 Term 04/27/07 6121w0d 24:00 9 lb (4.082 kg) F CS-Classical EPI  LIV       The following portions of the patient's history were reviewed and updated as appropriate: allergies, current medications, past family history, past medical history, past social history, past surgical history and problem list.  Review of Systems Pertinent items noted in HPI and remainder of comprehensive ROS otherwise negative.    Objective:      Vitals:   11/01/17 1002  BP: 107/67  Pulse: 63  Weight: 188 lb (85.3 kg)  Height: 5\' 5"  (1.651 m)   Vitals:  WNL General appearance: alert, cooperative and no distress  HEENT: Normocephalic, without obvious abnormality, atraumatic Eyes: negative Throat: lips, mucosa, and tongue normal; teeth and gums normal  Respiratory: Clear to auscultation bilaterally  CV: Regular rate and rhythm  Breasts:  Normal appearance, no masses or tenderness, no nipple retraction or dimpling  GI: Soft, non-tender; bowel sounds normal; no  masses,  no organomegaly  GU: External Genitalia:  Tanner V, no lesion Urethra:  No prolapse   Vagina: Pink, normal rugae, no blood or discharge  Cervix: No CMT, no lesion  Uterus:  Normal size and contour, non tender  Adnexa: Normal, no masses, non tender  Musculoskeletal: No edema, redness or tenderness in the calves or thighs  Skin: No lesions or rash  Lymphatic: Axillary adenopathy: none     Psychiatric: Normal mood and behavior        Assessment:    Healthy female exam.   Insomnia Difficulties losign weight   Plan:   Pap smear in 2020 Pt had moderate anxiety which can be affecting sleep.  Will start Cymbalta 30 mg daily. RTC 6-8 weeks and retake GAD.  Can add sleep medicaton  Refer to Dr. Karie Schwalbe for PCP.  Has migraines that need treatment.

## 2017-12-20 ENCOUNTER — Encounter: Payer: Self-pay | Admitting: Obstetrics & Gynecology

## 2017-12-20 ENCOUNTER — Ambulatory Visit (INDEPENDENT_AMBULATORY_CARE_PROVIDER_SITE_OTHER): Payer: 59 | Admitting: Obstetrics & Gynecology

## 2017-12-20 VITALS — BP 114/74 | HR 73 | Resp 16 | Ht 65.0 in | Wt 190.0 lb

## 2017-12-20 DIAGNOSIS — F320 Major depressive disorder, single episode, mild: Secondary | ICD-10-CM

## 2017-12-20 DIAGNOSIS — G43009 Migraine without aura, not intractable, without status migrainosus: Secondary | ICD-10-CM | POA: Diagnosis not present

## 2017-12-20 MED ORDER — DULOXETINE HCL 30 MG PO CPEP: 30.0000 mg | ORAL_CAPSULE | Freq: Every day | ORAL | 3 refills | Status: DC

## 2017-12-20 NOTE — Patient Instructions (Addendum)
Emgalty (migraine prevention drug) Zanaflex -- night time muscle relaxer Baclofen -- day time muscle relaxer Trigger point injections Dry needling Biofreeze Thumb massage Kacey B at [facility]

## 2017-12-20 NOTE — Progress Notes (Signed)
   Subjective:    Patient ID: Tara Rocha, female    DOB: 1981-04-23, 37 y.o.   MRN: [id number]  HPI  Being treated for migraines with Topamax.  Having word loss but doing better.  PT for right upper extremity nerve damage and having camera around neck (heavy cameras)_.    Review of Systems  Constitutional: Negative.   Gastrointestinal: Negative.   Genitourinary: Negative.   Musculoskeletal: Positive for neck pain.  Psychiatric/Behavioral: Negative.   PHQ9=3 (improved)     Objective:   Physical Exam  Constitutional: She is oriented to person, place, and time. She appears well-developed and well-nourished. No distress.  HENT:  Head: Normocephalic and atraumatic.  Eyes: Conjunctivae are normal.  Neck:  Pain in trapezius, tension  Cardiovascular: Normal rate.  Pulmonary/Chest: Effort normal.  Abdominal: Soft. There is no tenderness.  Musculoskeletal: She exhibits no edema.  Neurological: She is alert and oriented to person, place, and time.  Skin: Skin is warm and dry.  Psychiatric: She has a normal mood and affect.  Vitals reviewed.  Vitals:   12/20/17 0914  BP: 114/74  Pulse: 73  Resp: 16  Weight: 190 lb (86.2 kg)  Height: 5\' 5"  (1.651 m)    Assessment & Plan:  37 yo female with depression [place] and upper extremity nerve/musculoskeletal tension/ nerve pinch  1.  Continue PT 2.  Pt doing much better with depression; continue Cymbalta 3.  Other options to consider  Emgalty (migraine prevention drug) Zanaflex -- night time muscle relaxer Baclofen -- day time muscle relaxer Trigger point injections Dry needling Biofreeze Thumb massage Kacey B at [facility]([place]  25 minutes spent face to face with patient >50% counseling about migraine treaments.

## 2018-10-01 ENCOUNTER — Ambulatory Visit (INDEPENDENT_AMBULATORY_CARE_PROVIDER_SITE_OTHER): Payer: No Typology Code available for payment source | Admitting: Obstetrics & Gynecology

## 2018-10-01 ENCOUNTER — Encounter: Payer: Self-pay | Admitting: Obstetrics & Gynecology

## 2018-10-01 VITALS — BP 122/77 | HR 72 | Ht 65.0 in | Wt 203.0 lb

## 2018-10-01 DIAGNOSIS — Z01419 Encounter for gynecological examination (general) (routine) without abnormal findings: Secondary | ICD-10-CM | POA: Diagnosis not present

## 2018-10-01 DIAGNOSIS — E559 Vitamin D deficiency, unspecified: Secondary | ICD-10-CM

## 2018-10-01 DIAGNOSIS — Z1151 Encounter for screening for human papillomavirus (HPV): Secondary | ICD-10-CM | POA: Diagnosis not present

## 2018-10-01 DIAGNOSIS — Z124 Encounter for screening for malignant neoplasm of cervix: Secondary | ICD-10-CM | POA: Diagnosis not present

## 2018-10-01 DIAGNOSIS — N6320 Unspecified lump in the left breast, unspecified quadrant: Secondary | ICD-10-CM | POA: Diagnosis not present

## 2018-10-01 DIAGNOSIS — N6325 Unspecified lump in the left breast, overlapping quadrants: Secondary | ICD-10-CM

## 2018-10-01 NOTE — Progress Notes (Addendum)
Subjective:     Tara Rocha is a 38 y.o. female here for a routine exam.  Current complaints: getting over the flu and pneumonia.  Concerned about hair loss.  Patient was suffering with migraine headaches last year.  Patient went to chiropractor and had some neck manipulation and is doing much better.  She is sleeping better and no longer is plagued with severe migraine headaches.   Gynecologic History Patient's last menstrual period was 09/02/2018. Contraception: tubal ligation Last Pap: 2017. Results were: normal Last mammogram: normal years ago.   Obstetric History OB History  Gravida Para Term Preterm AB Living  4 3 3   1 3   SAB TAB Ectopic Multiple Live Births  1     0 3    # Outcome Date GA Lbr Len/2nd Weight Sex Delivery Anes PTL Lv  4 Term 01/19/15 [redacted]w[redacted]d  7 lb 7.2 oz (3.379 kg) M CS-LTranv Spinal, EPI  LIV  3 Term 03/18/11 [redacted]w[redacted]d  7 lb 4 oz (3.289 kg) F CS-Classical EPI  LIV     Birth Comments: poly hydramnios  2 SAB 02/24/10 [redacted]w[redacted]d         1 Term 04/27/07 [redacted]w[redacted]d 24:00 9 lb (4.082 kg) F CS-Classical EPI  LIV     The following portions of the patient's history were reviewed and updated as appropriate: allergies, current medications, past family history, past medical history, past social history, past surgical history and problem list.  Review of Systems Pertinent items noted in HPI and remainder of comprehensive ROS otherwise negative.    Objective:      Vitals:   10/01/18 0921  BP: 122/77  Pulse: 72  Weight: 203 lb (92.1 kg)  Height: 5\' 5"  (1.651 m)   Vitals:  WNL General appearance: alert, cooperative and no distress  HEENT: Normocephalic, without obvious abnormality, atraumatic Eyes: negative Throat: lips, mucosa, and tongue normal; teeth and gums normal  Respiratory: Clear to auscultation bilaterally  CV: Regular rate and rhythm  Breasts:  Normal appearance, no masses or tenderness, no nipple retraction or dimpling  GI: Soft, non-tender; bowel sounds  normal; no masses,  no organomegaly  GU: External Genitalia:  Tanner V, no lesion Urethra:  No prolapse   Vagina: Pink, normal rugae, no blood or discharge  Cervix: No CMT, no lesion  Uterus:  Normal size and contour, non tender  Adnexa: Normal, no masses, non tender  Musculoskeletal: No edema, redness or tenderness in the calves or thighs  Skin: New dark macule above right eye, near temple/hairline.  Present 2-3 months.    Lymphatic: Axillary adenopathy: none     Psychiatric: Normal mood and behavior     Assessment:    Healthy female exam.   Left breast pain with exam History Vit D deficiency   Plan:    Pap with co testing Mammogram age 22--low risk by family history Contraception BTL Vit d nml last February--we will check again this week when she comes in fasting for yearly labs. Left breast pain with breast exam--polycystic breasts on clinical exam.  Will get [place] of left breast to evaluate painful area.

## 2018-10-01 NOTE — Progress Notes (Signed)
Last pap 02/23/16- neg

## 2018-10-02 LAB — CYTOLOGY - PAP
DIAGNOSIS: NEGATIVE
HPV: NOT DETECTED

## 2018-10-03 ENCOUNTER — Ambulatory Visit (INDEPENDENT_AMBULATORY_CARE_PROVIDER_SITE_OTHER): Payer: No Typology Code available for payment source

## 2018-10-03 DIAGNOSIS — Z13220 Encounter for screening for lipoid disorders: Secondary | ICD-10-CM

## 2018-10-03 DIAGNOSIS — E559 Vitamin D deficiency, unspecified: Secondary | ICD-10-CM

## 2018-10-03 LAB — COMPREHENSIVE METABOLIC PANEL
AG Ratio: 1.7 (calc) (ref 1.0–2.5)
ALBUMIN MSPROF: 3.9 g/dL (ref 3.6–5.1)
ALT: 14 U/L (ref 6–29)
AST: 11 U/L (ref 10–30)
Alkaline phosphatase (APISO): 59 U/L (ref 33–115)
BUN: 20 mg/dL (ref 7–25)
CO2: 29 mmol/L (ref 20–32)
Calcium: 8.8 mg/dL (ref 8.6–10.2)
Chloride: 105 mmol/L (ref 98–110)
Creat: 0.81 mg/dL (ref 0.50–1.10)
Globulin: 2.3 g/dL (calc) (ref 1.9–3.7)
Glucose, Bld: 80 mg/dL (ref 65–99)
POTASSIUM: 5.2 mmol/L (ref 3.5–5.3)
Sodium: 140 mmol/L (ref 135–146)
Total Bilirubin: 0.5 mg/dL (ref 0.2–1.2)
Total Protein: 6.2 g/dL (ref 6.1–8.1)

## 2018-10-03 LAB — LIPID PANEL
Cholesterol: 187 mg/dL (ref <200)
HDL: 44 mg/dL — ABNORMAL LOW (ref >50)
LDL Cholesterol (Calc): 126 mg/dL (calc) — ABNORMAL HIGH
Non-HDL Cholesterol (Calc): 143 mg/dL (calc) — ABNORMAL HIGH (ref <130)
Total CHOL/HDL Ratio: 4.3 (calc) (ref <5.0)
Triglycerides: 77 mg/dL (ref <150)

## 2018-10-03 LAB — CBC
HCT: 41.6 % (ref 35.0–45.0)
HEMOGLOBIN: 14.5 g/dL (ref 11.7–15.5)
MCH: 31.4 pg (ref 27.0–33.0)
MCHC: 34.9 g/dL (ref 32.0–36.0)
MCV: 90 fL (ref 80.0–100.0)
MPV: 8.7 fL (ref 7.5–12.5)
Platelets: 341 10*3/uL (ref 140–400)
RBC: 4.62 10*6/uL (ref 3.80–5.10)
RDW: 11.8 % (ref 11.0–15.0)
WBC: 5.5 10*3/uL (ref 3.8–10.8)

## 2018-10-03 LAB — TSH: TSH: 1.59 mIU/L

## 2018-10-03 NOTE — Progress Notes (Signed)
Pt here for fasting blood work. Blood drawn. Pt is aware we will call with results.

## 2018-10-09 ENCOUNTER — Encounter: Payer: Self-pay | Admitting: Obstetrics & Gynecology

## 2018-10-09 ENCOUNTER — Other Ambulatory Visit: Payer: Self-pay | Admitting: Obstetrics & Gynecology

## 2018-10-09 DIAGNOSIS — N6325 Unspecified lump in the left breast, overlapping quadrants: Secondary | ICD-10-CM

## 2018-10-09 DIAGNOSIS — N6320 Unspecified lump in the left breast, unspecified quadrant: Principal | ICD-10-CM

## 2018-10-09 DIAGNOSIS — E7800 Pure hypercholesterolemia, unspecified: Secondary | ICD-10-CM | POA: Insufficient documentation

## 2018-10-12 ENCOUNTER — Ambulatory Visit
Admission: RE | Admit: 2018-10-12 | Discharge: 2018-10-12 | Disposition: A | Discharge status: Home / Self Care | Payer: No Typology Code available for payment source | Source: Ambulatory Visit | Attending: Obstetrics & Gynecology | Admitting: Obstetrics & Gynecology

## 2018-10-12 DIAGNOSIS — N6325 Unspecified lump in the left breast, overlapping quadrants: Secondary | ICD-10-CM

## 2018-10-12 DIAGNOSIS — N6320 Unspecified lump in the left breast, unspecified quadrant: Principal | ICD-10-CM

## 2018-10-15 ENCOUNTER — Telehealth: Payer: Self-pay | Admitting: *Deleted

## 2018-10-15 NOTE — Telephone Encounter (Signed)
-----   Message from Lesly Dukes, MD sent at 10/11/2018  2:09 PM EST ----- Elevated cholesterol and needs to f/u with PCP.  Pt does not have my chart.  Please call.

## 2018-10-15 NOTE — Telephone Encounter (Signed)
Copy of labs mailed to pt's home address with recommendations for her to F/U with her PCP regarding her Lipids.

## 2019-02-25 ENCOUNTER — Encounter: Payer: Self-pay | Admitting: Obstetrics & Gynecology

## 2019-02-25 ENCOUNTER — Other Ambulatory Visit: Payer: Self-pay

## 2019-02-25 ENCOUNTER — Ambulatory Visit (INDEPENDENT_AMBULATORY_CARE_PROVIDER_SITE_OTHER): Payer: No Typology Code available for payment source | Admitting: Obstetrics & Gynecology

## 2019-02-25 DIAGNOSIS — N921 Excessive and frequent menstruation with irregular cycle: Secondary | ICD-10-CM | POA: Diagnosis not present

## 2019-02-25 NOTE — Progress Notes (Signed)
TELEHEALTH GYNECOLOGY VIRTUAL VIDEO VISIT ENCOUNTER NOTE  Provider [facility]: [facility] [place]   I connected with Carlyle DollyShawna L Jewel on 02/25/19 at  3:00 PM EDT by WebEx Video Encounter at home and verified that I am speaking with the correct person using two identifiers.   I discussed the limitations, risks, security and privacy concerns of performing an evaluation and management service by telephone and the availability of in person appointments. I also discussed with the patient that there may be a patient responsible charge related to this service. The patient expressed understanding and agreed to proceed.   History:  Carlyle DollyShawna L Vanderhoof is a 38 y.o. [phone] female being evaluated today for menometrorrhagia. Pt is having closer and closer cycles that are heavy.  Pt's cycles are 19 days apart.  Pt is passing clots and having to wear super tampons with pads.  Pt feeling week and husband thought she looked pale yesterday while on her menses.  Pt uses BTL for contraception.  Pt not on hormonal birth control.       Past Medical History:  Diagnosis Date  . 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 Headache    migraines  . History of staph infection 08-2011   Required surgical excision (Left thigh)  . Hypoglycemia   . Leg pain   . Poor circulation   . Varicose veins    Past Surgical History:  Procedure Laterality Date  . CESAREAN SECTION  2008, 2012  . CESAREAN SECTION WITH BILATERAL TUBAL LIGATION Bilateral 01/19/2015   Procedure: REPEAT CESAREAN SECTION;  Surgeon: Lesly DukesKelly H Jonothan Heberle, MD;  Location: [facility];  Service: Obstetrics;  Laterality: Bilateral;  . ENDOVENOUS ABLATION SAPHENOUS VEIN W/ LASER Left 10-03-2013   left greater saphenous vein and stab phlebectomies > 20 incisions left leg by Gretta Beganodd Early MD   . ENDOVENOUS ABLATION SAPHENOUS VEIN W/ LASER Right 10-17-2013   endovenous laser ablation with stab phlebectomy 10-20 incisions right leg  . INCISION  AND DRAINAGE ABSCESS N/A 01/31/2015   Procedure: INCISION AND DRAINAGE ABSCESS;  Surgeon: Tilda BurrowJohn Ferguson V, MD;  Location: [facility];  Service: Gynecology;  Laterality: N/A;  . LEG SKIN LESION  BIOPSY / EXCISION  08-2011   Left upper thigh skin excision for Staph Infection  . TONSILLECTOMY     age 38  . TUBAL LIGATION  01/19/2015   Procedure: BILATERAL TUBAL LIGATION;  Surgeon: Lesly DukesKelly H Pola Furno, MD;  Location: [facility];  Service: Obstetrics;;   The following portions of the patient's history were reviewed and updated as appropriate: allergies, current medications, past family history, past medical history, past social history, past surgical history and problem list.    Review of Systems:  Pertinent items noted in HPI and remainder of comprehensive ROS otherwise negative.  Physical Exam:   General:  Alert, oriented and cooperative. Patient appears to be in no acute distress.  Mental Status: Normal mood and affect. Normal behavior. Normal judgment and thought content.   Respiratory: Normal respiratory effort, no problems with respiration noted  Rest of physical exam deferred due to type of encounter  Labs and Imaging No results found for this or any previous visit (from the past 336 hour(s)). No results found.     Assessment and Plan:     1. Menometrorrhagia - CBC - US PELVIS (TRANSABDOMINAL ONLY); Future - US PELVIS TRANSVANGINAL NON-OB (TV ONLY); Future  Will look for structural abnormality. Will treat based on test results.  May need endometrial biopsy.        I discussed the assessment and treatment plan with the patient. The patient was provided an opportunity to ask questions and all were answered. The patient agreed with the plan and demonstrated an understanding of the instructions.   The patient was advised to call back or seek an in-person evaluation/go to the ED if the symptoms worsen or if the condition fails to improve as anticipated.  I provided 15 minutes of face-to-face  time during this encounter.   Elsie Lincoln, MD [facility], [facility]

## 2019-02-26 ENCOUNTER — Ambulatory Visit: Payer: No Typology Code available for payment source

## 2019-02-27 ENCOUNTER — Ambulatory Visit (INDEPENDENT_AMBULATORY_CARE_PROVIDER_SITE_OTHER): Payer: No Typology Code available for payment source | Admitting: *Deleted

## 2019-02-27 ENCOUNTER — Ambulatory Visit (INDEPENDENT_AMBULATORY_CARE_PROVIDER_SITE_OTHER): Payer: No Typology Code available for payment source

## 2019-02-27 ENCOUNTER — Other Ambulatory Visit: Payer: Self-pay

## 2019-02-27 DIAGNOSIS — N921 Excessive and frequent menstruation with irregular cycle: Secondary | ICD-10-CM

## 2019-02-27 NOTE — Progress Notes (Signed)
CBC drawn.

## 2019-02-28 LAB — CBC
HCT: 42.8 % (ref 35.0–45.0)
Hemoglobin: 13.7 g/dL (ref 11.7–15.5)
MCH: 29.8 pg (ref 27.0–33.0)
MCHC: 32 g/dL (ref 32.0–36.0)
MCV: 93 fL (ref 80.0–100.0)
MPV: 9.1 fL (ref 7.5–12.5)
Platelets: 246 10*3/uL (ref 140–400)
RBC: 4.6 10*6/uL (ref 3.80–5.10)
RDW: 12 % (ref 11.0–15.0)
WBC: 4.3 10*3/uL (ref 3.8–10.8)

## 2019-03-11 ENCOUNTER — Other Ambulatory Visit: Payer: Self-pay | Admitting: Obstetrics & Gynecology

## 2019-03-11 MED ORDER — TRANEXAMIC ACID 650 MG PO TABS: 1300.0000 mg | ORAL_TABLET | Freq: Three times a day (TID) | ORAL | 2 refills | Status: DC

## 2019-03-11 MED ORDER — HYDROCHLOROTHIAZIDE 12.5 MG PO TABS: ORAL_TABLET | ORAL | 0 refills | Status: DC

## 2019-03-11 NOTE — Progress Notes (Signed)
Pt having menorrhagia and would like to try Lysteda.    Pt also has problems with leg swelling and varicose veins, especially when she travels.  Rx for HCTZ 12.5 mg prn

## 2019-04-05 ENCOUNTER — Other Ambulatory Visit: Payer: Self-pay | Admitting: Obstetrics & Gynecology

## 2019-06-07 ENCOUNTER — Telehealth: Payer: Self-pay

## 2019-06-07 DIAGNOSIS — M7989 Other specified soft tissue disorders: Secondary | ICD-10-CM

## 2019-06-07 MED ORDER — HYDROCHLOROTHIAZIDE 12.5 MG PO TABS: ORAL_TABLET | ORAL | 0 refills | Status: DC

## 2019-06-07 NOTE — Telephone Encounter (Signed)
Pt requests refill of Hydrochlorothiazide. Refill sent.

## 2019-06-29 ENCOUNTER — Other Ambulatory Visit: Payer: Self-pay | Admitting: Obstetrics & Gynecology

## 2019-06-29 DIAGNOSIS — M7989 Other specified soft tissue disorders: Secondary | ICD-10-CM

## 2019-07-09 ENCOUNTER — Telehealth: Payer: Self-pay

## 2019-07-09 DIAGNOSIS — M7989 Other specified soft tissue disorders: Secondary | ICD-10-CM

## 2019-07-09 MED ORDER — HYDROCHLOROTHIAZIDE 12.5 MG PO TABS: ORAL_TABLET | ORAL | 0 refills | Status: DC

## 2019-07-09 NOTE — Telephone Encounter (Signed)
Pt called requesting a refill of Hydrochlorothiazide. Refill sent per Dr Gala Romney

## 2020-01-30 ENCOUNTER — Telehealth: Payer: Self-pay | Admitting: *Deleted

## 2020-01-30 NOTE — Telephone Encounter (Signed)
Returned call from 9:08 AM, left patient a message to call and schedule annual appointment.

## 2020-02-02 IMAGING — MG DIGITAL DIAGNOSTIC BILATERAL MAMMOGRAM WITH TOMO AND CAD
8 series · 8 of 24 positions shown · non-contrast
Comparison: Previous exam(s).

CLINICAL DATA: 37-year-old female with palpable lump in the OUTER
LEFT breast discovered on clinical examination.

EXAM:
DIGITAL DIAGNOSTIC BILATERAL MAMMOGRAM WITH CAD AND TOMO
ULTRASOUND LEFT BREAST

[L CC synth-2D]
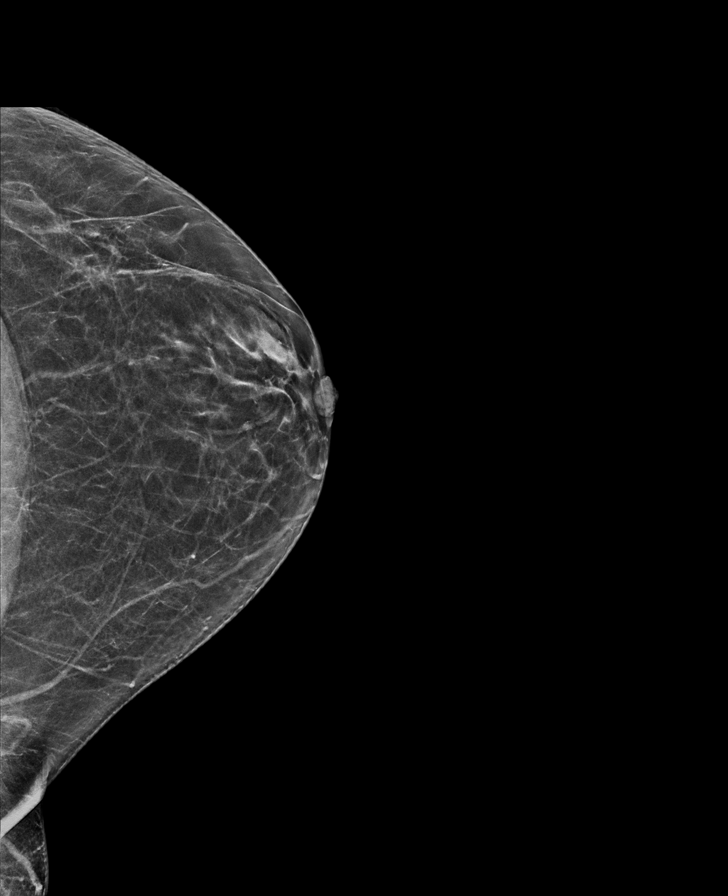

[R MLO synth-2D]
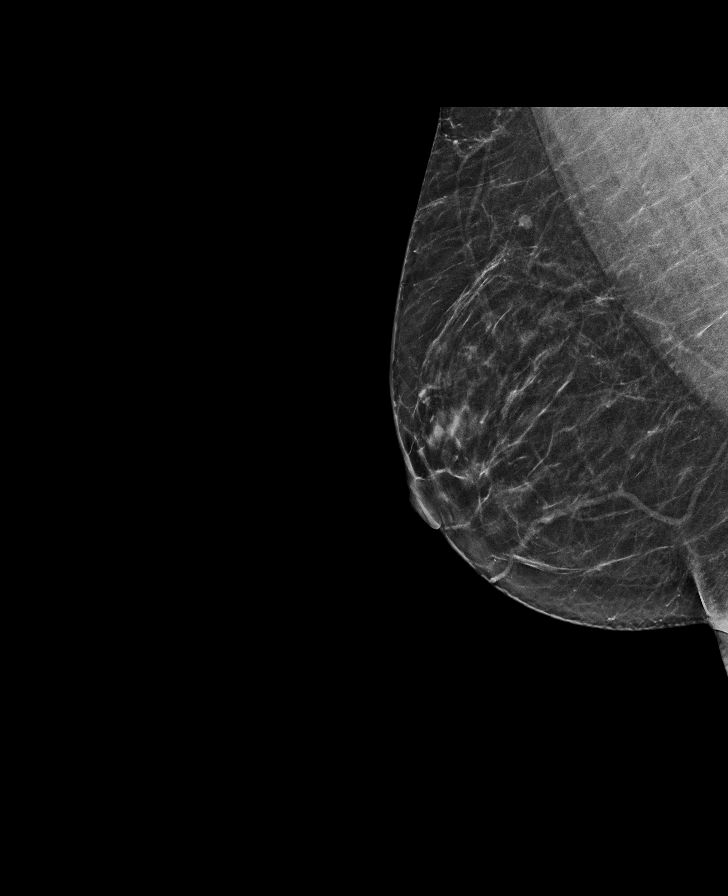

[L MLO synth-2D]
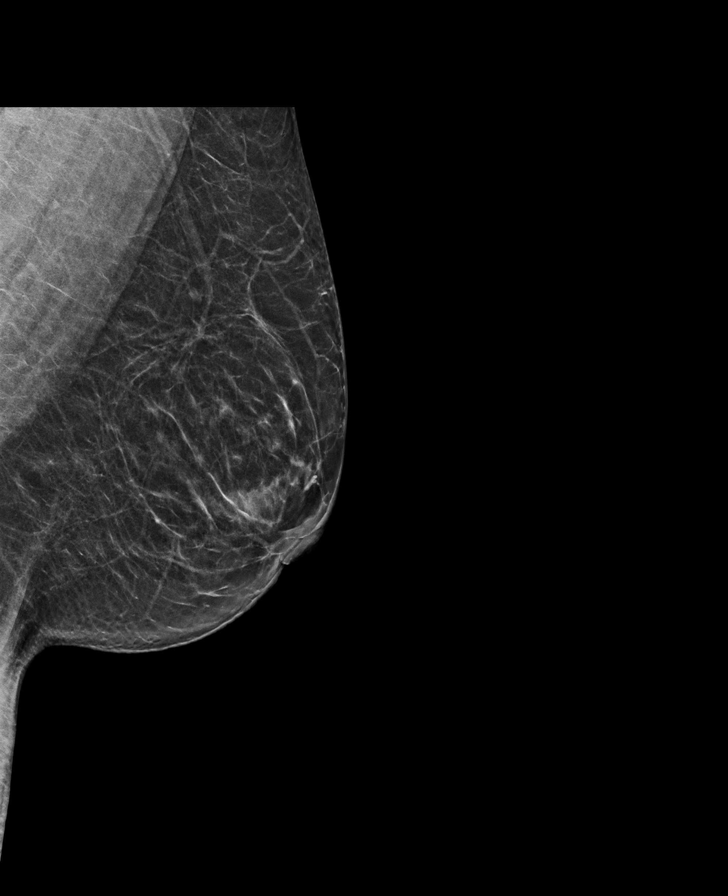

[R CC synth-2D]
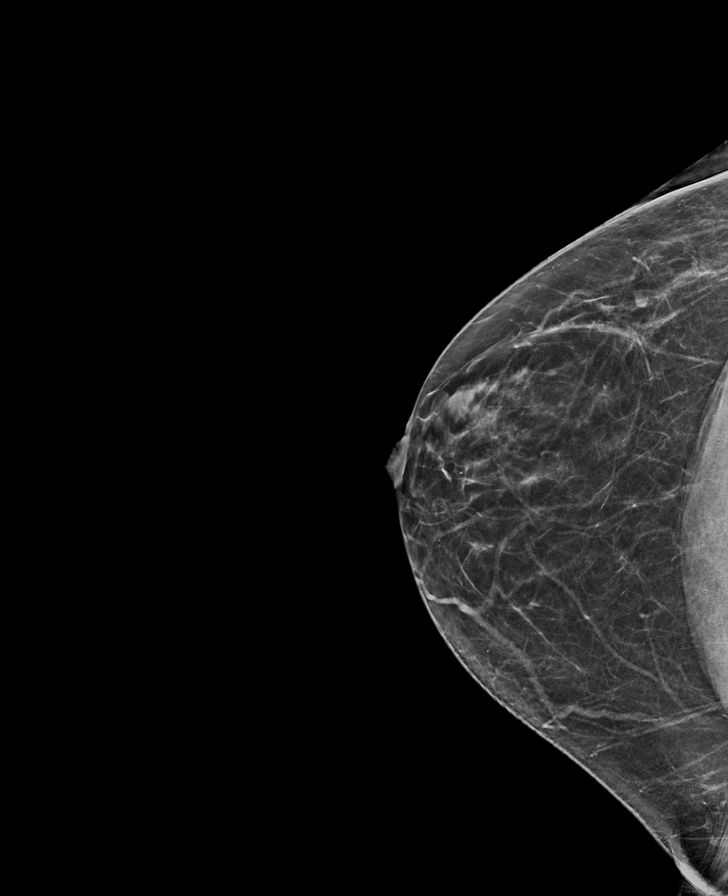

[L CC tomo · tomo slice 31/61.0]
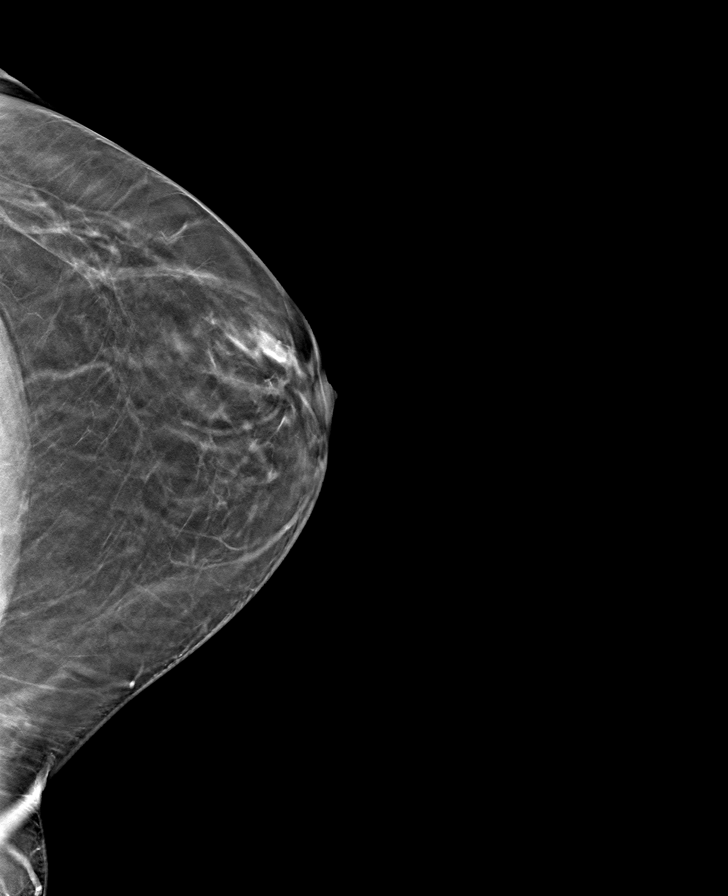

[R MLO tomo · tomo slice 33/64.0]
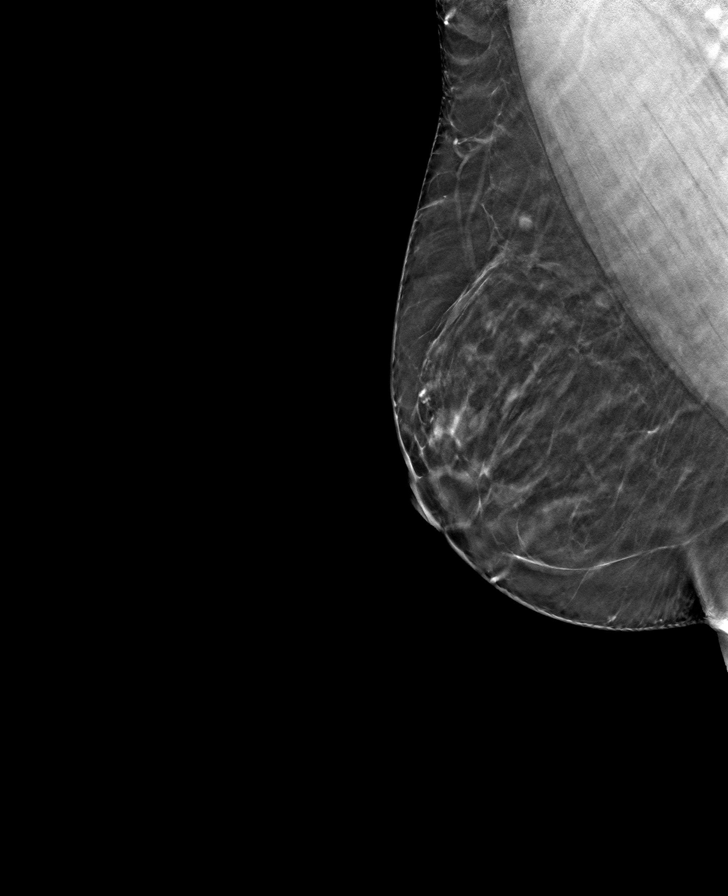

[R CC tomo · tomo slice 33/65.0]
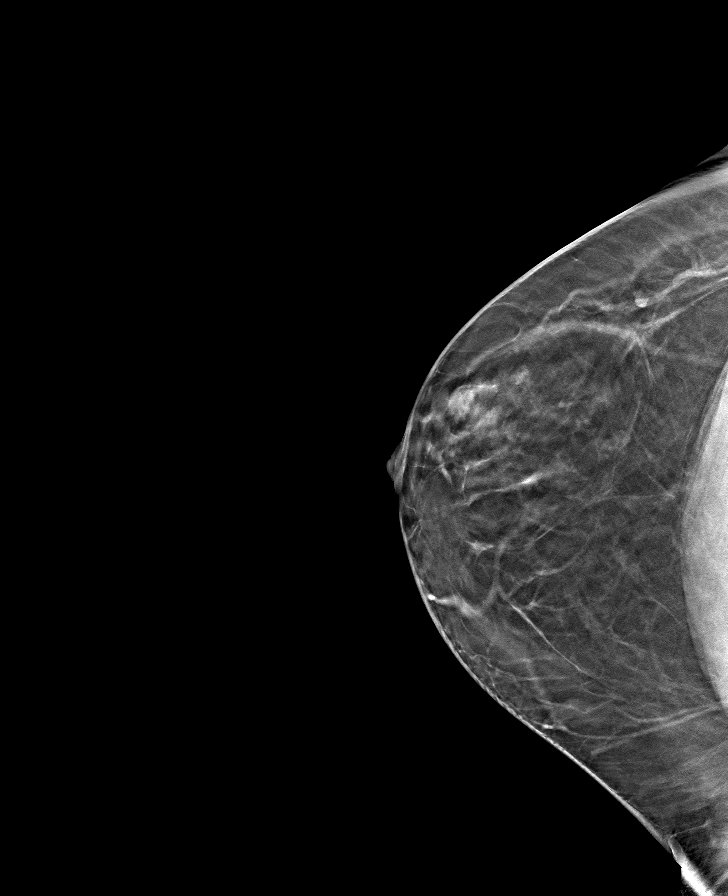

[L MLO tomo · tomo slice 33/64.0]
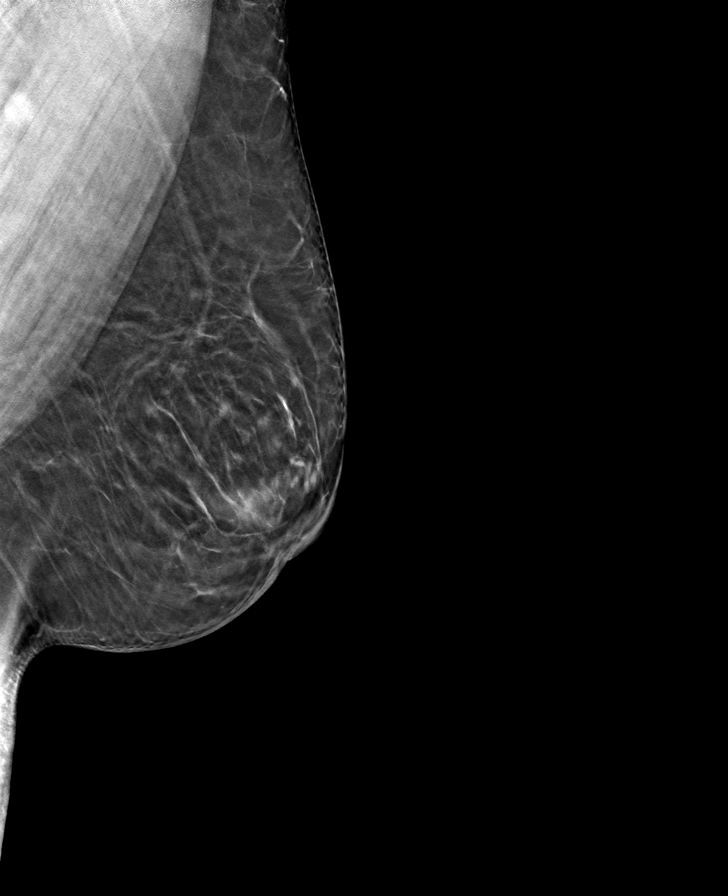

[8 of 24 positions shown; findings below may reference images not displayed]

ACR Breast Density Category b: There are scattered areas of
fibroglandular density.
FINDINGS: 2D/3D full field views of both breasts demonstrate no suspicious
mass, distortion or worrisome calcifications.

Mammographic images were processed with CAD.

On physical exam, mild thickening in the slightly OUTER LEFT breast
identified. No discrete palpable mass noted.

Targeted ultrasound is performed, showing no solid or cystic mass,
distortion or abnormal shadowing within the OUTER LEFT breast, in
the area of patient concern.
IMPRESSION: 1. No mammographic, suspicious palpable or sonographic abnormality
within the OUTER LEFT breast, in the area of patient concern.
2. No mammographic evidence of breast malignancy.

RECOMMENDATION:
Bilateral screening mammogram at age 42.

Clinical follow-up recommended for the palpable area of concern in
the LEFT breast. Any further workup should be based on clinical
grounds.

I have discussed the findings and recommendations with the patient.
Results were also provided in writing at the conclusion of the
visit. If applicable, a reminder letter will be sent to the patient
regarding the next appointment.

BI-RADS CATEGORY  1: Negative.

## 2020-03-02 ENCOUNTER — Ambulatory Visit: Payer: No Typology Code available for payment source

## 2020-03-16 ENCOUNTER — Other Ambulatory Visit: Payer: Self-pay

## 2020-03-16 ENCOUNTER — Encounter: Payer: Self-pay | Admitting: Obstetrics & Gynecology

## 2020-03-16 ENCOUNTER — Ambulatory Visit (INDEPENDENT_AMBULATORY_CARE_PROVIDER_SITE_OTHER): Payer: No Typology Code available for payment source | Admitting: Obstetrics & Gynecology

## 2020-03-16 VITALS — BP 119/82 | HR 78 | Resp 16 | Ht 64.0 in | Wt 198.0 lb

## 2020-03-16 DIAGNOSIS — Z01419 Encounter for gynecological examination (general) (routine) without abnormal findings: Secondary | ICD-10-CM | POA: Diagnosis not present

## 2020-03-16 DIAGNOSIS — E669 Obesity, unspecified: Secondary | ICD-10-CM | POA: Diagnosis not present

## 2020-03-16 NOTE — Progress Notes (Signed)
Subjective:     Tara Rocha is a 39 y.o. female here for a routine exam.  Current complaints: does not take lysteda.  Uses Ultra tampons which has helped with tampon changing.  Pt complaining of pain behind left ear and being evaluated by PCP.     Gynecologic History Patient's last menstrual period was 02/24/2020. Contraception: tubal ligation Last Pap: 2020. Results were: normal Last mammogram: 2020. Results were: normal  Obstetric History OB History  Gravida Para Term Preterm AB Living  4 3 3   1 3   SAB TAB Ectopic Multiple Live Births  1     0 3    # Outcome Date GA Lbr Len/2nd Weight Sex Delivery Anes PTL Lv  4 Term 01/19/15 [redacted]w[redacted]d  7 lb 7.2 oz (3.379 kg) M CS-LTranv Spinal, EPI  LIV  3 Term 03/18/11 [redacted]w[redacted]d  7 lb 4 oz (3.289 kg) F CS-Classical EPI  LIV     Birth Comments: poly hydramnios  2 SAB 02/24/10 [redacted]w[redacted]d         1 Term 04/27/07 [redacted]w[redacted]d 24:00 9 lb (4.082 kg) F CS-Classical EPI  LIV     The following portions of the patient's history were reviewed and updated as appropriate: allergies, current medications, past family history, past medical history, past social history, past surgical history and problem list.  Review of Systems Pertinent items noted in HPI and remainder of comprehensive ROS otherwise negative.    Objective:      Vitals:   03/16/20 0905  BP: 119/82  Pulse: 78  Resp: 16  Weight: 198 lb (89.8 kg)  Height: 5\' 4"  (1.626 m)   Vitals:  WNL General appearance: alert, cooperative and no distress  HEENT: Normocephalic, without obvious abnormality, atraumatic Eyes: negative Throat: lips, mucosa, and tongue normal; teeth and gums normal  Respiratory: Clear to auscultation bilaterally  CV: Regular rate and rhythm  Breasts:  Normal appearance, no masses or tenderness, no nipple retraction or dimpling  GI: Soft, non-tender; bowel sounds normal; no masses,  no organomegaly  GU: External Genitalia:  Tanner V, no lesion Urethra:  No prolapse   Vagina: Pink,  normal rugae, no blood or discharge  Cervix: No CMT, no lesion  Uterus:  Normal size and contour, non tender  Adnexa: Normal, no masses, non tender  Musculoskeletal: No edema, redness or tenderness in the calves or thighs  Skin: No lesions or rash  Lymphatic: Axillary adenopathy: none     Psychiatric: Normal mood and behavior        Assessment:    Healthy female exam.    Plan:   Pap up to date Mammogram at 42 Compression stockings for varicose veins Yearly health maintenance labs (pt is fasting) Pt would like to see weight loss MD nad referred to dr. 03/18/20.

## 2020-03-17 LAB — LIPID PANEL
Cholesterol: 202 mg/dL — ABNORMAL HIGH (ref <200)
HDL: 75 mg/dL (ref >=50)
LDL Cholesterol (Calc): 113 mg/dL (calc) — ABNORMAL HIGH
Non-HDL Cholesterol (Calc): 127 mg/dL (calc) (ref <130)
Total CHOL/HDL Ratio: 2.7 (calc) (ref <5.0)
Triglycerides: 62 mg/dL (ref <150)

## 2020-03-17 LAB — COMPREHENSIVE METABOLIC PANEL
AG Ratio: 1.8 (calc) (ref 1.0–2.5)
ALT: 9 U/L (ref 6–29)
AST: 17 U/L (ref 10–30)
Albumin: 4.2 g/dL (ref 3.6–5.1)
Alkaline phosphatase (APISO): 44 U/L (ref 31–125)
BUN: 10 mg/dL (ref 7–25)
CO2: 30 mmol/L (ref 20–32)
Calcium: 9.5 mg/dL (ref 8.6–10.2)
Chloride: 102 mmol/L (ref 98–110)
Creat: 0.75 mg/dL (ref 0.50–1.10)
Globulin: 2.4 g/dL (calc) (ref 1.9–3.7)
Glucose, Bld: 80 mg/dL (ref 65–99)
Potassium: 5.2 mmol/L (ref 3.5–5.3)
Sodium: 140 mmol/L (ref 135–146)
Total Bilirubin: 0.6 mg/dL (ref 0.2–1.2)
Total Protein: 6.6 g/dL (ref 6.1–8.1)

## 2020-03-17 LAB — CBC
HCT: 44.8 % (ref 35.0–45.0)
Hemoglobin: 14.2 g/dL (ref 11.7–15.5)
MCH: 30.4 pg (ref 27.0–33.0)
MCHC: 31.7 g/dL — ABNORMAL LOW (ref 32.0–36.0)
MCV: 95.9 fL (ref 80.0–100.0)
MPV: 9 fL (ref 7.5–12.5)
Platelets: 261 10*3/uL (ref 140–400)
RBC: 4.67 10*6/uL (ref 3.80–5.10)
RDW: 12.1 % (ref 11.0–15.0)
WBC: 4.5 10*3/uL (ref 3.8–10.8)

## 2020-03-17 LAB — TSH: TSH: 1.93 mIU/L

## 2020-03-17 LAB — VITAMIN D 25 HYDROXY (VIT D DEFICIENCY, FRACTURES): Vit D, 25-Hydroxy: 32 ng/mL (ref 30–100)

## 2020-06-19 IMAGING — US TRANSVAGINAL ULTRASOUND OF PELVIS
1 series · 14 of 25 positions shown · non-contrast
Comparison: 12/11/2012

CLINICAL DATA: menometrorrhagia



[Series 1: transvaginal ultrasound of pelvis · 0.21mm/px · 165 acquisitions, 14 frames shown]
[im 1/165]
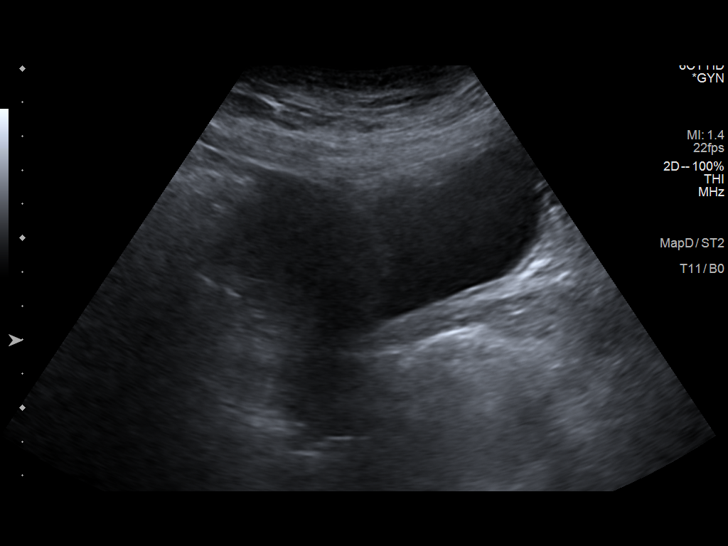
[im 14/165]
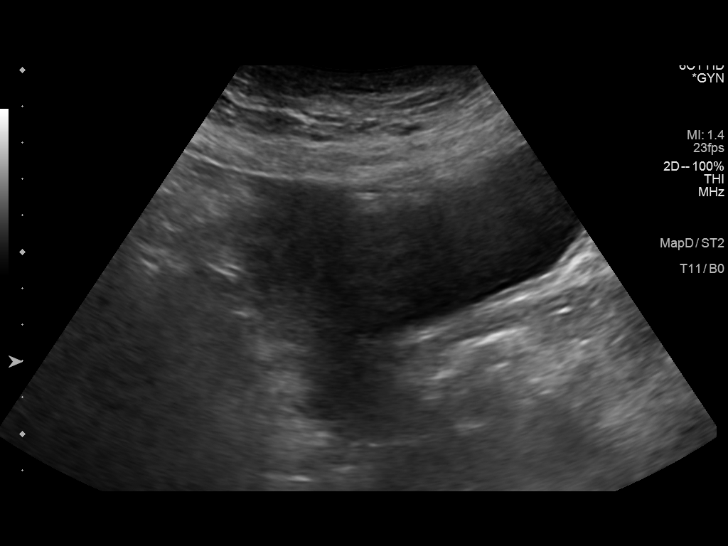
[im 28/165]
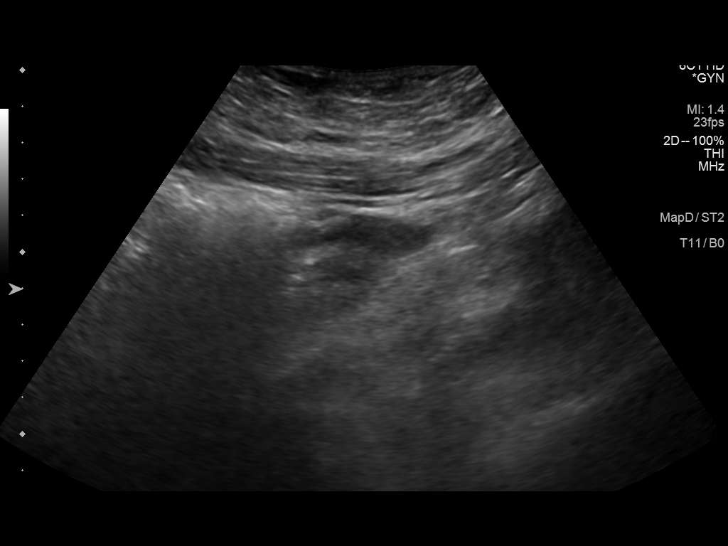
[im 42/165]
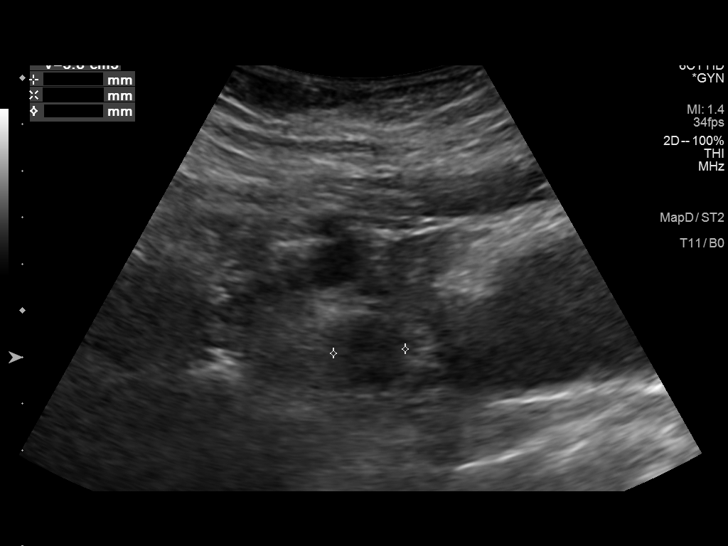
[im 55/165]
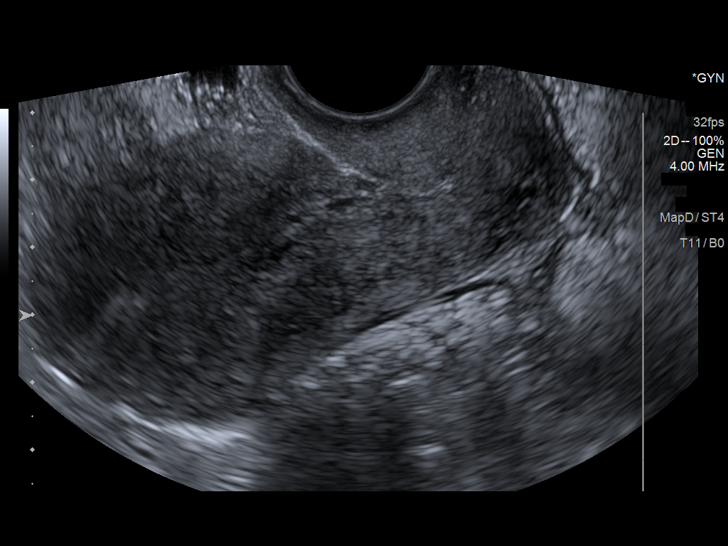
[im 62/165]
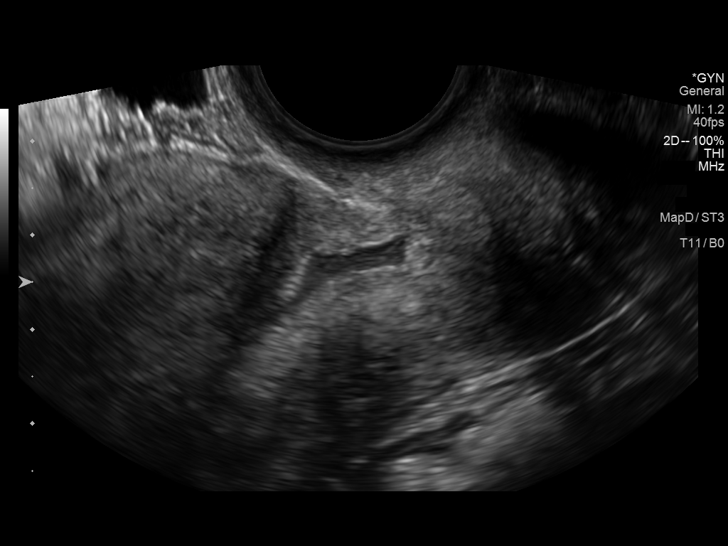
[im 76/165]
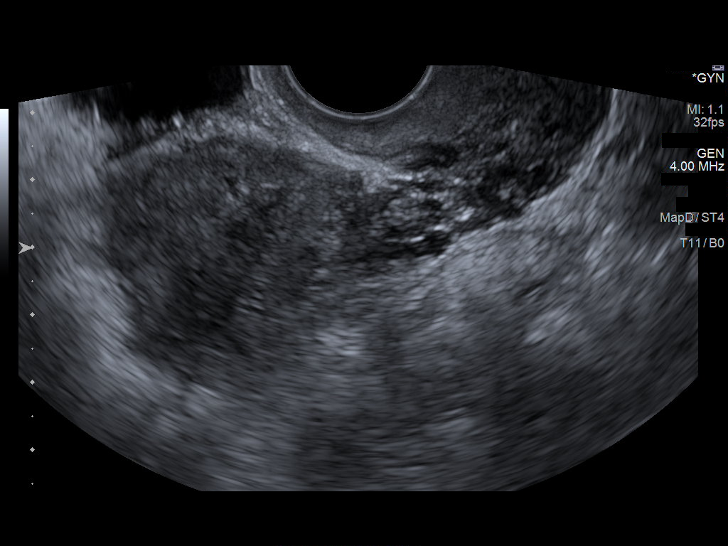
[im 89/165]
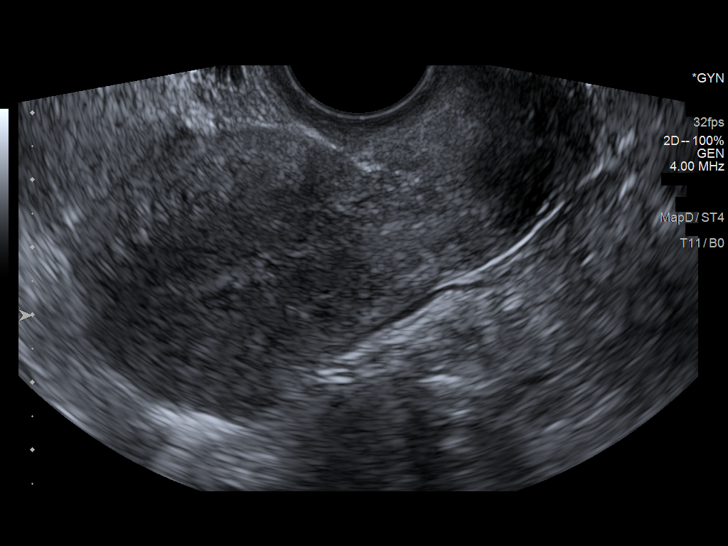
[im 103/165]
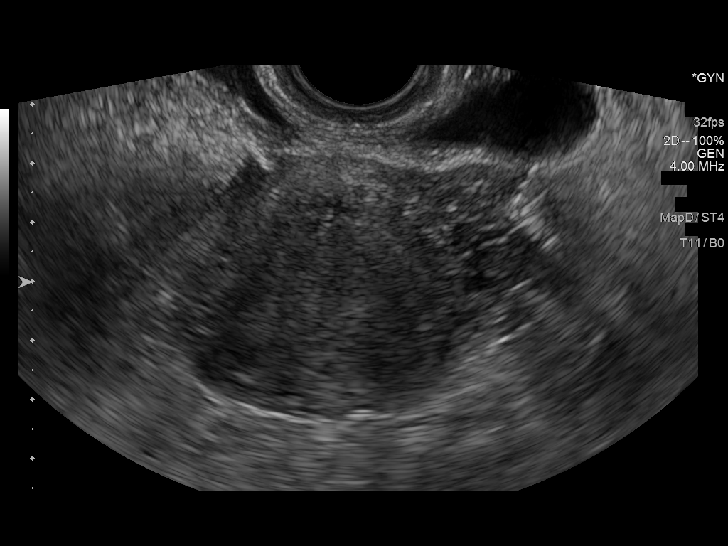
[im 110/165]
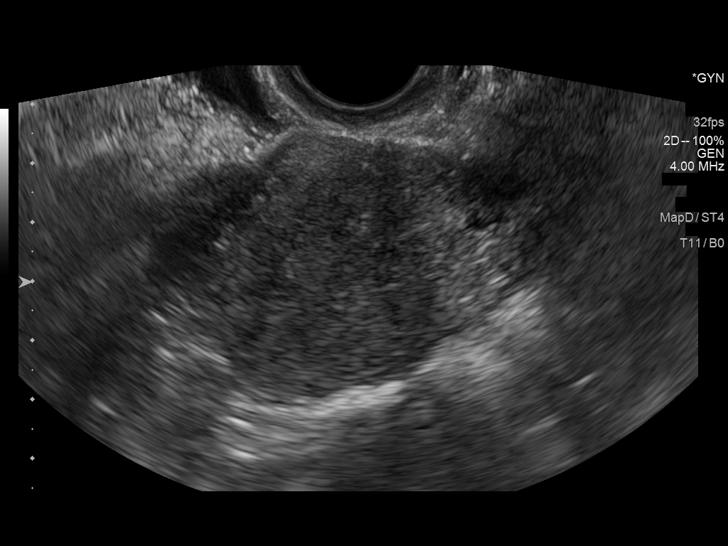
[im 124/165]
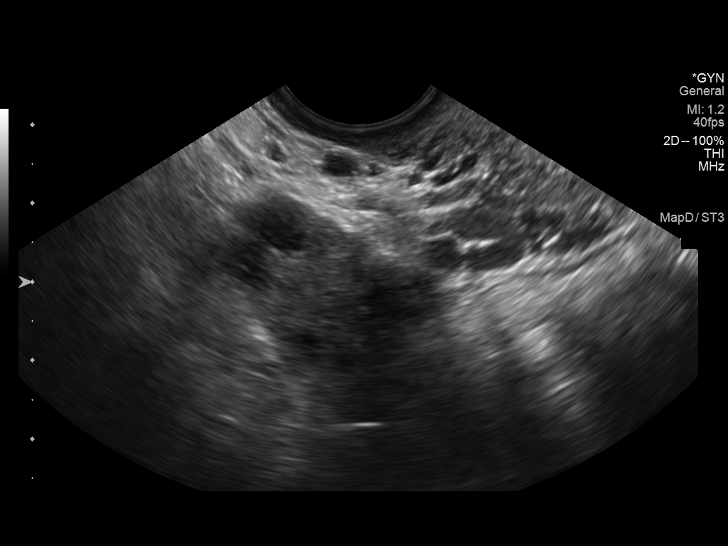
[im 137/165]
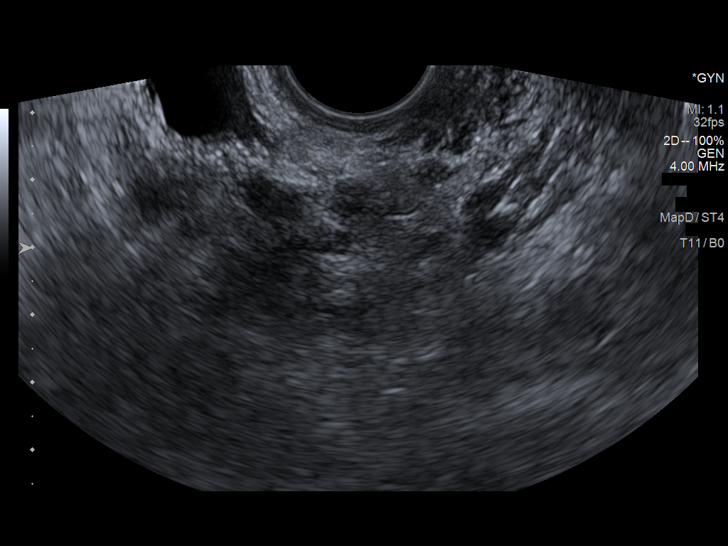
[im 151/165]
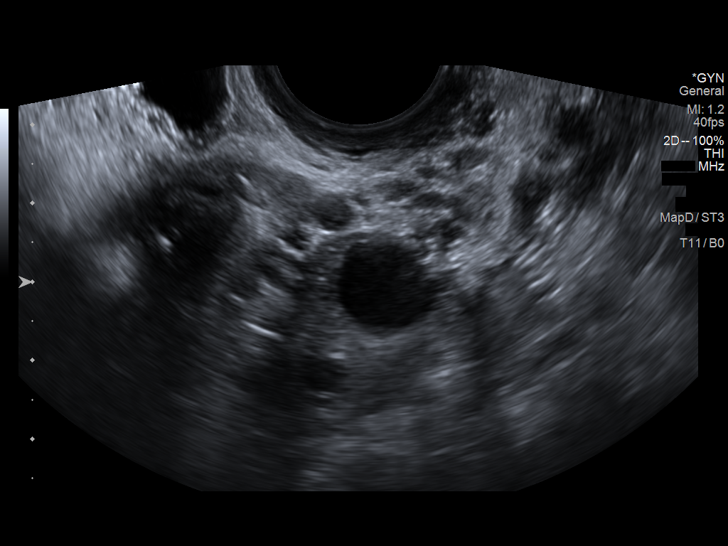
[im 165/165]
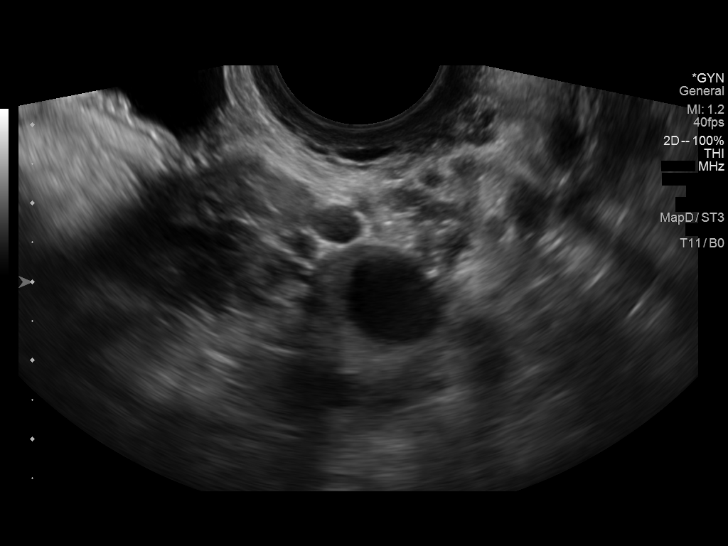

[14 of 25 positions shown; findings below may reference images not displayed]

FINDINGS: Uterus

Measurements: 8.1 x 4.3 x 5.3 cm = volume: 96 mL. No fibroids or
other mass visualized.

Endometrium

Thickness: 4 mm in thickness. Small amount of fluid within the
endometrial canal. No focal abnormality visualized.

Right ovary

Measurements: 2.7 x 1.7 x 2.5 cm = volume: 6.0 mL. Normal
appearance/no adnexal mass.

Left ovary

Measurements: 2.5 x 1.9 x 1.9 cm = volume: 5.0 mL. 1.4 cm dominant
follicle.

Other findings

No abnormal free fluid.
IMPRESSION: No acute findings. Small amount of fluid within the endometrial
canal in the lower uterine segment.

## 2020-10-27 ENCOUNTER — Telehealth: Payer: Self-pay | Admitting: *Deleted

## 2020-10-27 NOTE — Telephone Encounter (Signed)
Returned call from 12:36 PM. Left patient a message to call the office to schedule, also times that the office phone is open for scheduling.

## 2020-11-23 ENCOUNTER — Other Ambulatory Visit: Payer: Self-pay | Admitting: *Deleted

## 2020-11-23 DIAGNOSIS — M7989 Other specified soft tissue disorders: Secondary | ICD-10-CM

## 2020-11-23 MED ORDER — HYDROCHLOROTHIAZIDE 12.5 MG PO TABS: ORAL_TABLET | ORAL | 0 refills | Status: DC

## 2020-11-23 NOTE — Telephone Encounter (Signed)
Pt called requesting a RF on her HCTZ  [facility] [address]

## 2021-02-11 ENCOUNTER — Telehealth: Payer: Self-pay | Admitting: *Deleted

## 2021-02-11 DIAGNOSIS — M7989 Other specified soft tissue disorders: Secondary | ICD-10-CM

## 2021-02-11 MED ORDER — HYDROCHLOROTHIAZIDE 12.5 MG PO TABS: ORAL_TABLET | ORAL | 1 refills | Status: DC

## 2021-02-11 NOTE — Telephone Encounter (Signed)
RF request sent to [facility] for HCTZ per Dr Penne Lash.

## 2021-03-05 ENCOUNTER — Other Ambulatory Visit: Payer: Self-pay | Admitting: Obstetrics & Gynecology

## 2021-03-05 DIAGNOSIS — M7989 Other specified soft tissue disorders: Secondary | ICD-10-CM

## 2021-03-22 ENCOUNTER — Other Ambulatory Visit: Payer: Self-pay

## 2021-03-22 ENCOUNTER — Encounter: Payer: Self-pay | Admitting: Obstetrics & Gynecology

## 2021-03-22 ENCOUNTER — Ambulatory Visit (INDEPENDENT_AMBULATORY_CARE_PROVIDER_SITE_OTHER): Payer: Self-pay | Admitting: Obstetrics & Gynecology

## 2021-03-22 VITALS — BP 113/79 | HR 90 | Ht 64.0 in | Wt 179.0 lb

## 2021-03-22 DIAGNOSIS — Z01419 Encounter for gynecological examination (general) (routine) without abnormal findings: Secondary | ICD-10-CM

## 2021-03-22 DIAGNOSIS — E559 Vitamin D deficiency, unspecified: Secondary | ICD-10-CM

## 2021-03-22 DIAGNOSIS — I890 Lymphedema, not elsewhere classified: Secondary | ICD-10-CM

## 2021-03-22 DIAGNOSIS — M7989 Other specified soft tissue disorders: Secondary | ICD-10-CM

## 2021-03-22 MED ORDER — MEDICAL COMPRESSION SOCKS MISC: 0 refills | Status: DC

## 2021-03-22 MED ORDER — HYDROCHLOROTHIAZIDE 12.5 MG PO TABS: 25.0000 mg | ORAL_TABLET | Freq: Every day | ORAL | 1 refills | Status: DC

## 2021-03-22 MED ORDER — ALPRAZOLAM 0.25 MG PO TABS: 0.2500 mg | ORAL_TABLET | Freq: Every evening | ORAL | 0 refills | Status: DC | PRN

## 2021-03-22 NOTE — Progress Notes (Signed)
Subjective:     Tara Rocha is a 40 y.o. female here for a routine exam.  Current complaints: anxious with temporary living arrangements--parents moved in for 2 months.  Father has OCD and restless.  Pt lost more weight with Dr. Cathey Endow and regained 10 pounds due to stress eating.  Pt having increased leg swelling and HCTZ 12.5 not working as well.  She is also having more pain.  Already wears medical grade compression stockings.     Gynecologic History Patient's last menstrual period was 02/24/2021. Contraception: tubal ligation Last Pap: 2020. Results were: normal Last mammogram: 2020. Results were: normal  Obstetric History OB History  Gravida Para Term Preterm AB Living  4 3 3   1 3   SAB IAB Ectopic Multiple Live Births  1     0 3    # Outcome Date GA Lbr Len/2nd Weight Sex Delivery Anes PTL Lv  4 Term 01/19/15 [redacted]w[redacted]d  7 lb 7.2 oz (3.379 kg) M CS-LTranv Spinal, EPI  LIV  3 Term 03/18/11 [redacted]w[redacted]d  7 lb 4 oz (3.289 kg) F CS-Classical EPI  LIV     Birth Comments: poly hydramnios  2 SAB 02/24/10 [redacted]w[redacted]d         1 Term 04/27/07 [redacted]w[redacted]d 24:00 9 lb (4.082 kg) F CS-Classical EPI  LIV     The following portions of the patient's history were reviewed and updated as appropriate: allergies, current medications, past family history, past medical history, past social history, past surgical history, and problem list.  Review of Systems Pertinent items noted in HPI and remainder of comprehensive ROS otherwise negative.    Objective:     Vitals:   03/22/21 0906  BP: 113/79  Pulse: 90  Weight: 179 lb (81.2 kg)  Height: 5\' 4"  (1.626 m)   Vitals:  WNL General appearance: alert, cooperative and no distress  HEENT: Normocephalic, without obvious abnormality, atraumatic Eyes: negative Throat: lips, mucosa, and tongue normal; teeth and gums normal  Respiratory: Clear to auscultation bilaterally  CV: Regular rate and rhythm  Breasts:  Normal appearance, no masses or tenderness, no nipple  retraction or dimpling  GI: Soft, non-tender; bowel sounds normal; no masses,  no organomegaly  GU: External Genitalia:  Tanner V, small varicose veins on labia minora Urethra:  No prolapse   Vagina: Pink, normal rugae, no blood or discharge  Cervix: No CMT, no lesion  Uterus:  Normal size and contour, non tender  Adnexa: Normal, no masses, non tender  Musculoskeletal: No edema, redness or tenderness in the calves or thighs, +varicose veins and edema.  Skin: No lesions or rash  Lymphatic: Axillary adenopathy: none     Psychiatric: Normal mood and behavior        Assessment:    Healthy female exam.    Plan:    Pap not due until 2023 Mammogram at 42 Increase HCTZ to 25 mg daily Referral back to vein specialist for evaluation of reflux.  New Rx for compression stockings Xanax for moderate situational anxiety due to parents living with her temporarily (see above)

## 2021-03-22 NOTE — Progress Notes (Signed)
Last pap- 10/01/18- negative

## 2021-05-10 ENCOUNTER — Telehealth: Payer: Self-pay | Admitting: *Deleted

## 2021-05-10 MED ORDER — HYDROCHLOROTHIAZIDE 12.5 MG PO TABS: 25.0000 mg | ORAL_TABLET | Freq: Every day | ORAL | 1 refills | Status: DC

## 2021-05-10 NOTE — Telephone Encounter (Signed)
Pt called requesting a RF on her HCTZ.  She now takes 2 tablets daily and her RX was only for 30 tablets.  She is wanting to switch to Lasix 20 mg.  I will forward this to Dr Penne Lash to decide if she wants to switch the RX.

## 2021-05-13 ENCOUNTER — Other Ambulatory Visit: Payer: Self-pay | Admitting: Obstetrics & Gynecology

## 2021-05-13 MED ORDER — FUROSEMIDE 20 MG PO TABS: ORAL_TABLET | ORAL | 3 refills | Status: AC

## 2021-05-13 NOTE — Progress Notes (Signed)
Lasix prn prescribed for swelling when HCTZ doesn't work.

## 2021-05-15 ENCOUNTER — Other Ambulatory Visit: Payer: Self-pay | Admitting: Obstetrics & Gynecology

## 2021-05-15 MED ORDER — HYDROCHLOROTHIAZIDE 12.5 MG PO TABS: 25.0000 mg | ORAL_TABLET | Freq: Every day | ORAL | 3 refills | Status: DC

## 2021-05-18 ENCOUNTER — Ambulatory Visit (INDEPENDENT_AMBULATORY_CARE_PROVIDER_SITE_OTHER): Payer: Self-pay | Admitting: Advanced Practice Midwife

## 2021-05-18 ENCOUNTER — Encounter: Payer: Self-pay | Admitting: Advanced Practice Midwife

## 2021-05-18 ENCOUNTER — Other Ambulatory Visit: Payer: Self-pay

## 2021-05-18 ENCOUNTER — Other Ambulatory Visit (HOSPITAL_COMMUNITY)
Admission: RE | Admit: 2021-05-18 | Discharge: 2021-05-18 | Disposition: A | Discharge status: Home / Self Care | Payer: Self-pay | Source: Ambulatory Visit | Attending: Advanced Practice Midwife | Admitting: Advanced Practice Midwife

## 2021-05-18 VITALS — BP 99/73 | HR 81 | Resp 16 | Ht 64.0 in | Wt 165.0 lb

## 2021-05-18 DIAGNOSIS — T192XXA Foreign body in vulva and vagina, initial encounter: Secondary | ICD-10-CM

## 2021-05-18 DIAGNOSIS — N898 Other specified noninflammatory disorders of vagina: Secondary | ICD-10-CM | POA: Insufficient documentation

## 2021-05-18 MED ORDER — CEPHALEXIN 500 MG PO CAPS: 500.0000 mg | ORAL_CAPSULE | Freq: Four times a day (QID) | ORAL | 0 refills | Status: DC

## 2021-05-18 NOTE — Progress Notes (Signed)
   GYNECOLOGY PROGRESS NOTE  History:  40 y.o. [id number] presents to [facility] office today for problem gyn visit. She reports she discovered a retained tampon over the weekend, 2 days prior to to this visit.  She removed the tampon, which she reported was black, mixed with mucous and had an odor.  Today she reports some mild body aches and dizziness. There are no other symptoms. She has not tried any treatments.   The following portions of the patient's history were reviewed and updated as appropriate: allergies, current medications, past family history, past medical history, past social history, past surgical history and problem list. Last pap smear on 10/01/2018 was normal, negative HRHPV.  Health Maintenance Due  Topic Date Due   COVID-19 Vaccine (1) Never done   Hepatitis C Screening  Never done   INFLUENZA VACCINE  04/26/2021     Review of Systems:  Pertinent items are noted in HPI.   Objective:  Physical Exam Blood pressure 99/73, pulse 81, resp. rate 16, height 5\' 4"  (1.626 m), weight 165 lb (74.8 kg), last menstrual period 05/09/2021. VS reviewed, nursing note reviewed,  Constitutional: well developed, well nourished, no distress HEENT: normocephalic CV: normal rate Pulm/chest wall: normal effort Breast Exam: deferred Abdomen: soft Neuro: alert and oriented x 3 Skin: warm, dry Psych: affect normal Pelvic exam: Cervix pink, visually closed, without lesion, scant white creamy discharge, no visual evidence of tampon or fibers on today's exam, vaginal walls and external genitalia normal   Assessment & Plan:  1. Vaginal odor  - Cervicovaginal ancillary only( [place])  2. Retained tampon, initial encounter --Tampon removed by pt prior to today's encounter, exam today wnl --Given pt mild systemic symptoms, consult Dr 05/11/2021 and plan to give short course of Keflex to prevent any staph infections/TSS.   --Reviewed symptoms of TSS and reasons for pt to seek emergency  care --F/U as needed if symptoms improve  - cephALEXin (KEFLEX) 500 MG capsule; Take 1 capsule (500 mg total) by mouth 4 (four) times daily.  Dispense: 28 capsule; Refill: 0   Adrian Blackwater, CNM 10:13 AM

## 2021-05-18 NOTE — Patient Instructions (Signed)
Some natural remedies/prevention to try for bacterial vaginosis: --Take a probiotic tablet/capsule every day for at least 1-2 months.   --Whenever you have symptoms, use boric acid suppositories or tampons with coconut oil and 2-3 drops of tea tree oil vaginally every night for a week.   --Do not use scented soaps/perfumes in the vaginal area, and do not overwash multiple times daily. --Wear breathable cotton underwear and do not wear tight restrictive clothing. --Limit pantyliner use, change your underwear several times daily instead. --Use condoms during intercourse.  

## 2021-05-19 LAB — CERVICOVAGINAL ANCILLARY ONLY
Bacterial Vaginitis (gardnerella): NEGATIVE
Candida Glabrata: NEGATIVE
Candida Vaginitis: NEGATIVE
Comment: NEGATIVE
Comment: NEGATIVE
Comment: NEGATIVE

## 2021-06-04 ENCOUNTER — Other Ambulatory Visit: Payer: Self-pay | Admitting: Obstetrics & Gynecology

## 2021-06-22 ENCOUNTER — Other Ambulatory Visit: Payer: Self-pay | Admitting: *Deleted

## 2021-06-22 MED ORDER — HYDROCHLOROTHIAZIDE 12.5 MG PO TABS: 25.0000 mg | ORAL_TABLET | Freq: Every day | ORAL | 3 refills | Status: DC

## 2021-06-22 NOTE — Telephone Encounter (Signed)
Pt called requesting [facility].  Per "The pharmacy the would not transfer the RX from from pharmacy to another"  RF sent to [facility].

## 2021-11-09 ENCOUNTER — Telehealth: Payer: Self-pay | Admitting: *Deleted

## 2021-11-09 NOTE — Telephone Encounter (Signed)
Returned call from 10:17 AM. Left patient a message to call and schedule.

## 2021-11-15 ENCOUNTER — Other Ambulatory Visit (HOSPITAL_COMMUNITY)
Admission: RE | Admit: 2021-11-15 | Discharge: 2021-11-15 | Disposition: A | Discharge status: Home / Self Care | Payer: 59 | Source: Ambulatory Visit | Attending: Family Medicine | Admitting: Family Medicine

## 2021-11-15 ENCOUNTER — Encounter: Payer: Self-pay | Admitting: Family Medicine

## 2021-11-15 ENCOUNTER — Other Ambulatory Visit: Payer: Self-pay

## 2021-11-15 ENCOUNTER — Ambulatory Visit: Payer: 59 | Admitting: Family Medicine

## 2021-11-15 VITALS — BP 117/76 | HR 71 | Ht 64.0 in | Wt 188.0 lb

## 2021-11-15 DIAGNOSIS — I83899 Varicose veins of unspecified lower extremities with other complications: Secondary | ICD-10-CM | POA: Diagnosis not present

## 2021-11-15 DIAGNOSIS — Z124 Encounter for screening for malignant neoplasm of cervix: Secondary | ICD-10-CM

## 2021-11-15 DIAGNOSIS — Z01411 Encounter for gynecological examination (general) (routine) with abnormal findings: Secondary | ICD-10-CM

## 2021-11-15 DIAGNOSIS — N390 Urinary tract infection, site not specified: Secondary | ICD-10-CM | POA: Diagnosis not present

## 2021-11-15 DIAGNOSIS — N6323 Unspecified lump in the left breast, lower outer quadrant: Secondary | ICD-10-CM | POA: Diagnosis not present

## 2021-11-15 DIAGNOSIS — N898 Other specified noninflammatory disorders of vagina: Secondary | ICD-10-CM

## 2021-11-15 DIAGNOSIS — B3731 Acute candidiasis of vulva and vagina: Secondary | ICD-10-CM | POA: Diagnosis not present

## 2021-11-15 LAB — POCT URINALYSIS DIPSTICK
Bilirubin, UA: NEGATIVE
Blood, UA: NEGATIVE
Glucose, UA: NEGATIVE
Ketones, UA: NEGATIVE
Leukocytes, UA: NEGATIVE
Nitrite, UA: NEGATIVE
Protein, UA: NEGATIVE
Spec Grav, UA: 1.02 (ref 1.010–1.025)
Urobilinogen, UA: 0.2 E.U./dL
pH, UA: 5 (ref 5.0–8.0)

## 2021-11-15 NOTE — Progress Notes (Signed)
Pt c/o vaginal dryness and lower pelvic pain during intercourse

## 2021-11-15 NOTE — Progress Notes (Signed)
Subjective:    Patient ID: Tara Rocha is a 41 y.o. female presenting with Recurrent UTI and Painful Intercourse  on 11/15/2021  HPI: Reports recent UTI, that was problematic. She noted treatment, with PCP but still feeling as if she cannot completely empty her bladder. 1.5 months of vaginal dryness and painful intercourse. Has h/o C-section x 1. Notes regular menses, q 21 days. Notes occasional hot flashes especially at night. Has no real vaginal discharge but the dryness is new. Notes new breast cyst on left. Has h/o cysts there, but notes new one. Notes she needs a pap smear. Reports leg swelling, worsening recently--has had vein treatments in the past.  Review of Systems  Constitutional:  Negative for chills and fever.  HENT:  Negative for congestion, ear discharge and sore throat.   Eyes:  Negative for discharge and redness.  Respiratory:  Negative for cough, shortness of breath and wheezing.   Cardiovascular:  Negative for leg swelling.  Gastrointestinal:  Negative for abdominal pain, constipation, diarrhea, nausea and vomiting.  Genitourinary:  Negative for hematuria.  Musculoskeletal:  Negative for back pain.  Skin:  Negative for rash.     Objective:    BP 117/76    Pulse 71    Ht $R'5\' 4"'wd$  (1.626 m)    Wt 188 lb (85.3 kg)    LMP 10/27/2021    BMI 32.27 kg/m  Physical Exam Constitutional:      Appearance: She is well-developed.  HENT:     Head: Normocephalic and atraumatic.  Eyes:     General: No scleral icterus.    Pupils: Pupils are equal, round, and reactive to light.  Neck:     Thyroid: No thyromegaly.  Cardiovascular:     Rate and Rhythm: Normal rate and regular rhythm.     Comments: Bilateral varicosities noted in LE Pulmonary:     Effort: Pulmonary effort is normal.     Breath sounds: Normal breath sounds.  Chest:  Breasts:    Left: Mass (small mobile, cysts noted) present.    Abdominal:     General: There is no distension.     Palpations: Abdomen  is soft.     Tenderness: There is no abdominal tenderness.  Genitourinary:    Comments: BUS normal, vagina is pink and rugated, cervix is parous without lesion, uterus is small and anteverted, no adnexal mass, diffuse pelvic tenderness.  Musculoskeletal:     Cervical back: Normal range of motion.     Right lower leg: Edema present.     Left lower leg: Edema present.  Skin:    General: Skin is warm and dry.  Neurological:     Mental Status: She is alert and oriented to person, place, and time.        Assessment & Plan:  Recurrent UTI - 99214 Check repeat urine cx, and CMP - Plan: POCT Urinalysis Dipstick, Urine Culture, Comp Met (CMET)  Mass of lower outer quadrant of left breast - 99214 - check u/s +/- mammogram--she prefers to not have to get mammogram - Plan: US BREAST LTD UNI RIGHT INC AXILLA  Vaginal dryness - 99214 - check cultures - Plan: Cervicovaginal ancillary only( [place]), TSH, Follicle stimulating hormone  Encounter for gynecological examination with abnormal finding - 99357 - check labs - Plan: CBC, Hemoglobin A1c  Screening for cervical cancer - 01779 - pap smear today - Plan: Cytology - PAP( [place])  Varicose veins of lower extremity with edema, unspecified laterality -  99214 - referral to [place] vein specialist - Plan: Ambulatory referral to Vascular Surgery   Return in about 3 months (around 02/12/2022) for a follow-up.  Donnamae Jude, MD 11/15/2021 11:07 AM

## 2021-11-16 ENCOUNTER — Other Ambulatory Visit: Payer: Self-pay | Admitting: Family Medicine

## 2021-11-16 ENCOUNTER — Other Ambulatory Visit: Payer: Self-pay | Admitting: Obstetrics & Gynecology

## 2021-11-16 DIAGNOSIS — N6323 Unspecified lump in the left breast, lower outer quadrant: Secondary | ICD-10-CM

## 2021-11-16 LAB — CERVICOVAGINAL ANCILLARY ONLY
Bacterial Vaginitis (gardnerella): NEGATIVE
Candida Glabrata: NEGATIVE
Candida Vaginitis: POSITIVE — AB
Chlamydia: NEGATIVE
Comment: NEGATIVE
Comment: NEGATIVE
Comment: NEGATIVE
Comment: NEGATIVE
Comment: NEGATIVE
Comment: NORMAL
Neisseria Gonorrhea: NEGATIVE
Trichomonas: NEGATIVE

## 2021-11-16 LAB — CBC
HCT: 43.4 % (ref 35.0–45.0)
Hemoglobin: 14.5 g/dL (ref 11.7–15.5)
MCH: 30.5 pg (ref 27.0–33.0)
MCHC: 33.4 g/dL (ref 32.0–36.0)
MCV: 91.4 fL (ref 80.0–100.0)
MPV: 9.2 fL (ref 7.5–12.5)
Platelets: 269 10*3/uL (ref 140–400)
RBC: 4.75 10*6/uL (ref 3.80–5.10)
RDW: 11.6 % (ref 11.0–15.0)
WBC: 5 10*3/uL (ref 3.8–10.8)

## 2021-11-16 LAB — COMPREHENSIVE METABOLIC PANEL
AG Ratio: 2 (calc) (ref 1.0–2.5)
ALT: 8 U/L (ref 6–29)
AST: 14 U/L (ref 10–30)
Albumin: 4.2 g/dL (ref 3.6–5.1)
Alkaline phosphatase (APISO): 54 U/L (ref 31–125)
BUN: 13 mg/dL (ref 7–25)
CO2: 30 mmol/L (ref 20–32)
Calcium: 9.1 mg/dL (ref 8.6–10.2)
Chloride: 103 mmol/L (ref 98–110)
Creat: 0.82 mg/dL (ref 0.50–0.97)
Globulin: 2.1 g/dL (calc) (ref 1.9–3.7)
Glucose, Bld: 86 mg/dL (ref 65–139)
Potassium: 4.1 mmol/L (ref 3.5–5.3)
Sodium: 138 mmol/L (ref 135–146)
Total Bilirubin: 0.5 mg/dL (ref 0.2–1.2)
Total Protein: 6.3 g/dL (ref 6.1–8.1)

## 2021-11-16 LAB — HEMOGLOBIN A1C
Hgb A1c MFr Bld: 4.9 % of total Hgb (ref <5.7)
Mean Plasma Glucose: 94 mg/dL
eAG (mmol/L): 5.2 mmol/L

## 2021-11-16 LAB — FOLLICLE STIMULATING HORMONE: FSH: 4.5 m[IU]/mL

## 2021-11-16 LAB — TSH: TSH: 2.8 mIU/L

## 2021-11-16 MED ORDER — FLUCONAZOLE 150 MG PO TABS: 150.0000 mg | ORAL_TABLET | Freq: Every day | ORAL | 2 refills | Status: DC

## 2021-11-16 NOTE — Addendum Note (Signed)
Addended by: Donnamae Jude on: 11/16/2021 02:49 PM   Modules accepted: Orders

## 2021-11-17 LAB — URINE CULTURE
MICRO NUMBER:: 13034869
Result:: NO GROWTH
SPECIMEN QUALITY:: ADEQUATE

## 2021-11-17 LAB — CYTOLOGY - PAP
Comment: NEGATIVE
Diagnosis: NEGATIVE
High risk HPV: NEGATIVE

## 2021-11-22 ENCOUNTER — Ambulatory Visit: Payer: Self-pay | Admitting: Obstetrics & Gynecology

## 2021-12-06 ENCOUNTER — Ambulatory Visit
Admission: RE | Admit: 2021-12-06 | Discharge: 2021-12-06 | Disposition: A | Discharge status: Home / Self Care | Payer: 59 | Source: Ambulatory Visit | Attending: Family Medicine | Admitting: Family Medicine

## 2021-12-06 DIAGNOSIS — N6323 Unspecified lump in the left breast, lower outer quadrant: Secondary | ICD-10-CM

## 2021-12-20 ENCOUNTER — Other Ambulatory Visit: Payer: Self-pay | Admitting: Obstetrics & Gynecology

## 2022-08-29 ENCOUNTER — Encounter: Payer: Self-pay | Admitting: Obstetrics & Gynecology

## 2022-08-29 ENCOUNTER — Ambulatory Visit (INDEPENDENT_AMBULATORY_CARE_PROVIDER_SITE_OTHER): Payer: 59 | Admitting: Obstetrics & Gynecology

## 2022-08-29 ENCOUNTER — Other Ambulatory Visit (HOSPITAL_COMMUNITY)
Admission: RE | Admit: 2022-08-29 | Discharge: 2022-08-29 | Disposition: A | Discharge status: Home / Self Care | Payer: 59 | Source: Ambulatory Visit | Attending: Obstetrics & Gynecology | Admitting: Obstetrics & Gynecology

## 2022-08-29 DIAGNOSIS — N898 Other specified noninflammatory disorders of vagina: Secondary | ICD-10-CM | POA: Insufficient documentation

## 2022-08-29 DIAGNOSIS — N939 Abnormal uterine and vaginal bleeding, unspecified: Secondary | ICD-10-CM | POA: Diagnosis not present

## 2022-08-29 NOTE — Progress Notes (Signed)
   GYN VISIT Patient name: Tara Rocha MRN [id number]  Date of birth: [birth year] Chief Complaint:   Vaginal odor  History of Present Illness:   Tara Rocha is a 41 y.o. [phone] female being seen today for the following concerns:  -Started period on Thursday- which is early for her.  Last period x 2 week ago.  Blood was black and very light then started to get heavier and noted "bad smell."  She is currently having BRB.  Prior to this she did not have any issues with her periods. When removed tampon noted "horrible smell." The only other time she has had a bad odor like this is when she had a retained tampon and she wants to make sure that is not what is going on.  Denies pelvic or abdominal pain.  No other acute complaints.   Contraception: BTL      No data to display           Review of Systems:   Pertinent items are noted in HPI Denies fever/chills, dizziness, headaches, visual disturbances, fatigue, shortness of breath, chest pain, abdominal pain, vomiting, no problems with bowel movements, urination, or intercourse unless otherwise stated above.  Pertinent History Reviewed:  Reviewed past medical,surgical, social, obstetrical and family history.  Reviewed problem list, medications and allergies. Physical Assessment:  There were no vitals filed for this visit.There is no height or weight on file to calculate BMI.       Physical Examination:   General appearance: alert, well appearing, and in no distress  Psych: mood appropriate, normal affect  Skin: warm & dry   Cardiovascular: normal heart rate noted  Respiratory: normal respiratory effort, no distress  Abdomen: soft, non-tender   Pelvic: VULVA: normal appearing vulva with no masses, tenderness or lesions, VAGINA: normal appearing vagina with normal color, moderate blood ~ 20cc of BRB, no lesions, CERVIX: normal appearing cervix without discharge or lesions, no active bleeding UTERUS: uterus is normal size, shape,  consistency and nontender, ADNEXA: normal adnexa in size, nontender and no masses  Extremities: no edema   Chaperone:  pt declined     Assessment & Plan:  1) Vaginal odor -no retained tampon on exam -vaginitis panel obtained to r/o underlying infection -suspect odor due to blood and reassured pt of benign findings  -should intermenstrual/AUB recur may consider regulation of her menses- RTC if needed  Return if symptoms worsen or fail to improve.   Myna Hidalgo, DO Attending Obstetrician & Gynecologist, [facility] for [employer], [facility]

## 2022-08-30 LAB — CERVICOVAGINAL ANCILLARY ONLY
Bacterial Vaginitis (gardnerella): NEGATIVE
Candida Glabrata: NEGATIVE
Candida Vaginitis: NEGATIVE
Comment: NEGATIVE
Comment: NEGATIVE
Comment: NEGATIVE

## 2022-09-12 ENCOUNTER — Telehealth: Payer: Self-pay | Admitting: *Deleted

## 2022-09-12 NOTE — Telephone Encounter (Signed)
Left patient an urgent message to call and schedule appointment, but it will not be with Dr. Penne Lash and might not be until the beginning of the year.

## 2022-10-10 ENCOUNTER — Ambulatory Visit: Payer: 59 | Admitting: Obstetrics & Gynecology

## 2022-10-10 ENCOUNTER — Encounter: Payer: Self-pay | Admitting: Obstetrics & Gynecology

## 2022-10-10 ENCOUNTER — Ambulatory Visit (INDEPENDENT_AMBULATORY_CARE_PROVIDER_SITE_OTHER): Payer: 59 | Admitting: Obstetrics & Gynecology

## 2022-10-10 VITALS — BP 125/69 | HR 69 | Resp 16 | Ht 64.0 in | Wt 218.0 lb

## 2022-10-10 DIAGNOSIS — N9089 Other specified noninflammatory disorders of vulva and perineum: Secondary | ICD-10-CM

## 2022-10-10 DIAGNOSIS — R300 Dysuria: Secondary | ICD-10-CM

## 2022-10-10 NOTE — Progress Notes (Addendum)
GYN VISIT Patient name: Tara Rocha MRN [id number]  Date of birth: Jul 26, 1981 Chief Complaint:   Vaginal Pain  History of Present Illness:   Tara Rocha is a 42 y.o. [phone] female being seen today for the following concerns:  Vulvar pain: Started to note over the past few weeks.  Especially notes discomfort when she voids.    Sometimes dysuria, not all the time.  Swelling seems to come and go. Denies fever/chills.  Denies back pain.  Denies irregular bleeding or discharge.  No other acute complaints.  Of note, undergoing varicosity treatment in her legs and wasn't sure if this was related.     Patient's last menstrual period was 09/19/2022.      No data to display           Review of Systems:   Pertinent items are noted in HPI Denies fever/chills, dizziness, headaches, visual disturbances, fatigue, shortness of breath, chest pain, abdominal pain, vomiting, no problems with periods, bowel movements, or intercourse unless otherwise stated above.  Pertinent History Reviewed:  Reviewed past medical,surgical, social, obstetrical and family history.  Reviewed problem list, medications and allergies. Physical Assessment:   Vitals:   10/10/22 1301  BP: 125/69  Pulse: 69  Resp: 16  Weight: 218 lb (98.9 kg)  Height: 5\' 4"  (1.626 m)  Body mass index is 37.42 kg/m.       Physical Examination:   General appearance: alert, well appearing, and in no distress  Psych: mood appropriate, normal affect  Skin: warm & dry   Cardiovascular: normal heart rate noted  Respiratory: normal respiratory effort, no distress  Abdomen: soft, non-tender, no rebound, no guarding  Back: no CVA tenderness  Pelvic: VULVA: normal appearing vulva with no masses, tenderness or lesions.  Normal clitoris and clitoral hood- no swelling noted.  No varicosity or masses noted.  VAGINA: normal appearing vagina with normal color and discharge, no lesions  Extremities: no edema   Chaperone:  pt declined       Assessment & Plan:  1) Vulvar swelling, dysuria UA and culture obtained, further management pending results -no abnormalities noted on exam -reviewed conservative management []  pt to return when swelling present   Orders Placed This Encounter  Procedures   Urine Culture    Return if symptoms worsen or fail to improve.   Janyth Pupa, DO Attending [place], [facility] for [employer], [place]

## 2022-10-10 NOTE — Progress Notes (Signed)
Painful varicosity on Rt vulvar area

## 2022-10-12 LAB — URINE CULTURE

## 2022-11-17 NOTE — Progress Notes (Signed)
Last Mammogram:n/a Last Pap Smear:   11/18/21-negative Last Colon Screening;  n/a Seat Belts:   yes Sun Screen:   yes Dental Check Up:  yes Brush & Floss:  yes

## 2022-11-21 ENCOUNTER — Encounter: Payer: Self-pay | Admitting: Obstetrics & Gynecology

## 2022-11-21 ENCOUNTER — Ambulatory Visit (INDEPENDENT_AMBULATORY_CARE_PROVIDER_SITE_OTHER): Payer: 59 | Admitting: Obstetrics & Gynecology

## 2022-11-21 VITALS — BP 110/76 | Ht 64.0 in | Wt 222.0 lb

## 2022-11-21 DIAGNOSIS — Z01419 Encounter for gynecological examination (general) (routine) without abnormal findings: Secondary | ICD-10-CM

## 2022-11-21 DIAGNOSIS — M545 Low back pain, unspecified: Secondary | ICD-10-CM

## 2022-11-21 NOTE — Progress Notes (Signed)
Subjective:     Tara Rocha is a 42 y.o. female here for a routine exam.  Current complaints: low back pain--see chiropractor; still has pain paraspinal.   Gynecologic History Patient's last menstrual period was 11/09/2022. Contraception: tubal ligation Last Mammogram:n/a Last Pap Smear:   11/18/21-negative Last Colon Screening;  July 2023 negative  Seat Belts:   yes Sun Screen:   yes Dental Check Up:  yes Brush & Floss:  yes    Obstetric History OB History  Gravida Para Term Preterm AB Living  '4 3 3   1 3  '$ SAB IAB Ectopic Multiple Live Births  1     0 3    # Outcome Date GA Lbr Len/2nd Weight Sex Delivery Anes PTL Lv  4 Term 01/19/15 [redacted]w[redacted]d 7 lb 7.2 oz (3.379 kg) M CS-LTranv Spinal, EPI  LIV  3 Term 03/18/11 [id number] lb 4 oz (3.289 kg) F CS-Classical EPI  LIV     Birth Comments: poly hydramnios  2 SAB 02/24/10 [id number]      1 Term 04/27/07 [id number] 9 lb (4.082 kg) F CS-Classical EPI  LIV     The following portions of the patient's history were reviewed and updated as appropriate: allergies, current medications, past family history, past medical history, past social history, past surgical history, and problem list.  Review of Systems Pertinent items noted in HPI and remainder of comprehensive ROS otherwise negative.    Objective:     Vitals:   11/21/22 0809  BP: 110/76  Weight: 222 lb (100.7 kg)  Height: '5\' 4"'$  (1.626 m)   Vitals:  WNL General appearance: alert, cooperative and no distress  HEENT: Normocephalic, without obvious abnormality, atraumatic Eyes: negative Throat: lips, mucosa, and tongue normal; teeth and gums normal  Respiratory: Clear to auscultation bilaterally  CV: Regular rate and rhythm  Breasts:  Normal appearance, no masses or tenderness, no nipple retraction or dimpling  GI: Soft, non-tender; bowel sounds normal; no masses,  no organomegaly  GU: External Genitalia:  Tanner V, no lesion Urethra:  No prolapse   Vagina: Pink, normal rugae,  no blood or discharge  Cervix: No CMT, no lesion  Uterus:  Normal size and contour, non tender  Adnexa: Normal, no masses, non tender  Musculoskeletal: No edema, redness or tenderness in the calves or thighs; LBP  Skin: No lesions or rash  Lymphatic: Axillary adenopathy: none     Psychiatric: Normal mood and behavior        Assessment:    Healthy female exam.    Plan:   Pap up to date Mammogram yearly at age 26 366aternal uncles with colon cancer--unknown gene status; negative colonoscopy 2023 Qysemia for weight loss Lower central back pain--PT eval (also goes to chiropractor)
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
## 2022-12-12 ENCOUNTER — Encounter: Payer: Self-pay | Admitting: Rehabilitative and Restorative Service Providers"

## 2022-12-12 ENCOUNTER — Ambulatory Visit: Payer: 59 | Attending: Obstetrics & Gynecology | Admitting: Rehabilitative and Restorative Service Providers"

## 2022-12-12 ENCOUNTER — Other Ambulatory Visit: Payer: Self-pay

## 2022-12-12 DIAGNOSIS — M6281 Muscle weakness (generalized): Secondary | ICD-10-CM | POA: Insufficient documentation

## 2022-12-12 DIAGNOSIS — M545 Low back pain, unspecified: Secondary | ICD-10-CM | POA: Diagnosis present

## 2022-12-12 DIAGNOSIS — R29898 Other symptoms and signs involving the musculoskeletal system: Secondary | ICD-10-CM

## 2022-12-14 ENCOUNTER — Encounter: Payer: Self-pay | Admitting: Rehabilitative and Restorative Service Providers"

## 2022-12-14 ENCOUNTER — Ambulatory Visit: Payer: 59 | Admitting: Rehabilitative and Restorative Service Providers"

## 2022-12-14 DIAGNOSIS — M545 Low back pain, unspecified: Secondary | ICD-10-CM | POA: Diagnosis not present

## 2022-12-14 DIAGNOSIS — M6281 Muscle weakness (generalized): Secondary | ICD-10-CM

## 2022-12-14 DIAGNOSIS — R29898 Other symptoms and signs involving the musculoskeletal system: Secondary | ICD-10-CM
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
## 2022-12-15 ENCOUNTER — Ambulatory Visit: Payer: 59 | Admitting: Physical Therapy

## 2022-12-19 ENCOUNTER — Encounter: Payer: Self-pay | Admitting: Rehabilitative and Restorative Service Providers"

## 2022-12-19 ENCOUNTER — Ambulatory Visit: Payer: 59 | Admitting: Rehabilitative and Restorative Service Providers"

## 2022-12-19 DIAGNOSIS — R29898 Other symptoms and signs involving the musculoskeletal system: Secondary | ICD-10-CM

## 2022-12-19 DIAGNOSIS — M6281 Muscle weakness (generalized): Secondary | ICD-10-CM

## 2022-12-19 DIAGNOSIS — M545 Low back pain, unspecified: Secondary | ICD-10-CM
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
## 2022-12-26 ENCOUNTER — Encounter: Payer: Self-pay | Admitting: Rehabilitative and Restorative Service Providers"

## 2022-12-26 ENCOUNTER — Ambulatory Visit: Payer: 59 | Attending: Obstetrics & Gynecology | Admitting: Rehabilitative and Restorative Service Providers"

## 2022-12-26 DIAGNOSIS — M545 Low back pain, unspecified: Secondary | ICD-10-CM | POA: Insufficient documentation

## 2022-12-26 DIAGNOSIS — M6281 Muscle weakness (generalized): Secondary | ICD-10-CM | POA: Insufficient documentation

## 2022-12-26 DIAGNOSIS — R29898 Other symptoms and signs involving the musculoskeletal system: Secondary | ICD-10-CM | POA: Diagnosis present

## 2022-12-26 DIAGNOSIS — M539 Dorsopathy, unspecified: Secondary | ICD-10-CM | POA: Diagnosis present
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
## 2022-12-28 ENCOUNTER — Ambulatory Visit: Payer: 59 | Admitting: Rehabilitative and Restorative Service Providers"

## 2022-12-29 ENCOUNTER — Encounter: Payer: 59 | Admitting: Rehabilitative and Restorative Service Providers"

## 2023-01-02 ENCOUNTER — Ambulatory Visit: Payer: 59 | Admitting: Physical Therapy

## 2023-01-02 ENCOUNTER — Encounter: Payer: Self-pay | Admitting: Physical Therapy

## 2023-01-02 DIAGNOSIS — R29898 Other symptoms and signs involving the musculoskeletal system: Secondary | ICD-10-CM

## 2023-01-02 DIAGNOSIS — M545 Low back pain, unspecified: Secondary | ICD-10-CM | POA: Diagnosis not present

## 2023-01-02 DIAGNOSIS — M6281 Muscle weakness (generalized): Secondary | ICD-10-CM
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
## 2023-01-04 ENCOUNTER — Ambulatory Visit: Payer: 59 | Admitting: Rehabilitative and Restorative Service Providers"

## 2023-01-04 ENCOUNTER — Encounter: Payer: Self-pay | Admitting: Rehabilitative and Restorative Service Providers"

## 2023-01-04 DIAGNOSIS — M6281 Muscle weakness (generalized): Secondary | ICD-10-CM

## 2023-01-04 DIAGNOSIS — M545 Low back pain, unspecified: Secondary | ICD-10-CM | POA: Diagnosis not present

## 2023-01-04 DIAGNOSIS — R29898 Other symptoms and signs involving the musculoskeletal system: Secondary | ICD-10-CM
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
## 2023-01-05 ENCOUNTER — Other Ambulatory Visit: Payer: Self-pay | Admitting: *Deleted

## 2023-01-05 ENCOUNTER — Encounter: Payer: Self-pay | Admitting: Obstetrics & Gynecology

## 2023-01-05 DIAGNOSIS — M549 Dorsalgia, unspecified: Secondary | ICD-10-CM

## 2023-01-09 ENCOUNTER — Ambulatory Visit: Payer: 59 | Admitting: Rehabilitative and Restorative Service Providers"

## 2023-01-11 ENCOUNTER — Ambulatory Visit: Payer: 59 | Admitting: Rehabilitative and Restorative Service Providers"

## 2023-01-11 ENCOUNTER — Encounter: Payer: Self-pay | Admitting: Rehabilitative and Restorative Service Providers"

## 2023-01-11 DIAGNOSIS — M545 Low back pain, unspecified: Secondary | ICD-10-CM

## 2023-01-11 DIAGNOSIS — M6281 Muscle weakness (generalized): Secondary | ICD-10-CM

## 2023-01-11 DIAGNOSIS — R29898 Other symptoms and signs involving the musculoskeletal system: Secondary | ICD-10-CM

## 2023-01-11 DIAGNOSIS — M539 Dorsopathy, unspecified: Secondary | ICD-10-CM
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
## 2023-01-17 ENCOUNTER — Ambulatory Visit: Payer: 59 | Admitting: Rehabilitative and Restorative Service Providers"

## 2023-01-23 ENCOUNTER — Encounter: Payer: Self-pay | Admitting: Rehabilitative and Restorative Service Providers"

## 2023-01-23 ENCOUNTER — Ambulatory Visit: Payer: 59 | Admitting: Rehabilitative and Restorative Service Providers"

## 2023-01-23 DIAGNOSIS — M6281 Muscle weakness (generalized): Secondary | ICD-10-CM

## 2023-01-23 DIAGNOSIS — M545 Low back pain, unspecified: Secondary | ICD-10-CM

## 2023-01-23 DIAGNOSIS — R29898 Other symptoms and signs involving the musculoskeletal system: Secondary | ICD-10-CM

## 2023-01-23 DIAGNOSIS — M539 Dorsopathy, unspecified: Secondary | ICD-10-CM
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
## 2023-01-25 ENCOUNTER — Ambulatory Visit: Payer: 59 | Attending: Obstetrics & Gynecology | Admitting: Rehabilitative and Restorative Service Providers"

## 2023-01-25 ENCOUNTER — Encounter: Payer: Self-pay | Admitting: Rehabilitative and Restorative Service Providers"

## 2023-01-25 DIAGNOSIS — M545 Low back pain, unspecified: Secondary | ICD-10-CM | POA: Insufficient documentation

## 2023-01-25 DIAGNOSIS — M539 Dorsopathy, unspecified: Secondary | ICD-10-CM

## 2023-01-25 DIAGNOSIS — R29898 Other symptoms and signs involving the musculoskeletal system: Secondary | ICD-10-CM | POA: Diagnosis present

## 2023-01-25 DIAGNOSIS — M6281 Muscle weakness (generalized): Secondary | ICD-10-CM | POA: Insufficient documentation
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
## 2023-01-30 ENCOUNTER — Encounter: Payer: Self-pay | Admitting: Rehabilitative and Restorative Service Providers"

## 2023-01-30 ENCOUNTER — Ambulatory Visit: Payer: 59 | Admitting: Rehabilitative and Restorative Service Providers"

## 2023-01-30 DIAGNOSIS — R29898 Other symptoms and signs involving the musculoskeletal system: Secondary | ICD-10-CM

## 2023-01-30 DIAGNOSIS — M545 Low back pain, unspecified: Secondary | ICD-10-CM

## 2023-01-30 DIAGNOSIS — M539 Dorsopathy, unspecified: Secondary | ICD-10-CM

## 2023-01-30 DIAGNOSIS — M6281 Muscle weakness (generalized): Secondary | ICD-10-CM
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
## 2023-02-01 ENCOUNTER — Encounter: Payer: 59 | Admitting: Rehabilitative and Restorative Service Providers"

## 2023-02-02 ENCOUNTER — Ambulatory Visit: Payer: 59 | Admitting: Rehabilitative and Restorative Service Providers"

## 2023-02-06 ENCOUNTER — Encounter: Payer: Self-pay | Admitting: Rehabilitative and Restorative Service Providers"

## 2023-02-06 ENCOUNTER — Ambulatory Visit: Payer: 59 | Admitting: Rehabilitative and Restorative Service Providers"

## 2023-02-06 DIAGNOSIS — R29898 Other symptoms and signs involving the musculoskeletal system: Secondary | ICD-10-CM

## 2023-02-06 DIAGNOSIS — M545 Low back pain, unspecified: Secondary | ICD-10-CM

## 2023-02-06 DIAGNOSIS — M539 Dorsopathy, unspecified: Secondary | ICD-10-CM

## 2023-02-06 DIAGNOSIS — M6281 Muscle weakness (generalized): Secondary | ICD-10-CM
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
## 2023-02-08 ENCOUNTER — Ambulatory Visit: Payer: 59 | Admitting: Rehabilitative and Restorative Service Providers"

## 2023-02-08 ENCOUNTER — Encounter: Payer: Self-pay | Admitting: Rehabilitative and Restorative Service Providers"

## 2023-02-08 DIAGNOSIS — M539 Dorsopathy, unspecified: Secondary | ICD-10-CM

## 2023-02-08 DIAGNOSIS — R29898 Other symptoms and signs involving the musculoskeletal system: Secondary | ICD-10-CM

## 2023-02-08 DIAGNOSIS — M545 Low back pain, unspecified: Secondary | ICD-10-CM | POA: Diagnosis not present

## 2023-02-08 DIAGNOSIS — M6281 Muscle weakness (generalized): Secondary | ICD-10-CM
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
## 2023-02-21 ENCOUNTER — Ambulatory Visit: Payer: 59 | Admitting: Rehabilitative and Restorative Service Providers"

## 2023-03-07 ENCOUNTER — Encounter: Payer: Self-pay | Admitting: Rehabilitative and Restorative Service Providers"

## 2023-03-07 ENCOUNTER — Ambulatory Visit: Payer: 59 | Attending: Obstetrics & Gynecology | Admitting: Rehabilitative and Restorative Service Providers"

## 2023-03-07 DIAGNOSIS — M6281 Muscle weakness (generalized): Secondary | ICD-10-CM | POA: Diagnosis present

## 2023-03-07 DIAGNOSIS — M539 Dorsopathy, unspecified: Secondary | ICD-10-CM | POA: Insufficient documentation

## 2023-03-07 DIAGNOSIS — R29898 Other symptoms and signs involving the musculoskeletal system: Secondary | ICD-10-CM | POA: Insufficient documentation

## 2023-03-07 DIAGNOSIS — M545 Low back pain, unspecified: Secondary | ICD-10-CM | POA: Diagnosis present
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
## 2023-12-05 ENCOUNTER — Encounter (HOSPITAL_BASED_OUTPATIENT_CLINIC_OR_DEPARTMENT_OTHER): Payer: Self-pay | Admitting: Urology

## 2023-12-05 ENCOUNTER — Emergency Department (HOSPITAL_BASED_OUTPATIENT_CLINIC_OR_DEPARTMENT_OTHER)

## 2023-12-05 ENCOUNTER — Emergency Department (HOSPITAL_BASED_OUTPATIENT_CLINIC_OR_DEPARTMENT_OTHER)
Admission: EM | Admit: 2023-12-05 | Discharge: 2023-12-06 | Disposition: A | Discharge status: Home / Self Care | Attending: Emergency Medicine | Admitting: Emergency Medicine

## 2023-12-05 ENCOUNTER — Other Ambulatory Visit: Payer: Self-pay

## 2023-12-05 DIAGNOSIS — K529 Noninfective gastroenteritis and colitis, unspecified: Secondary | ICD-10-CM

## 2023-12-05 DIAGNOSIS — A0839 Other viral enteritis: Secondary | ICD-10-CM | POA: Diagnosis not present

## 2023-12-05 DIAGNOSIS — R109 Unspecified abdominal pain: Secondary | ICD-10-CM | POA: Diagnosis present

## 2023-12-05 DIAGNOSIS — R112 Nausea with vomiting, unspecified: Secondary | ICD-10-CM

## 2023-12-05 LAB — CBC
HCT: 40.9 % (ref 36.0–46.0)
Hemoglobin: 14.4 g/dL (ref 12.0–15.0)
MCH: 30.7 pg (ref 26.0–34.0)
MCHC: 35.2 g/dL (ref 30.0–36.0)
MCV: 87.2 fL (ref 80.0–100.0)
Platelets: 245 10*3/uL (ref 150–400)
RBC: 4.69 MIL/uL (ref 3.87–5.11)
RDW: 12.4 % (ref 11.5–15.5)
WBC: 9.1 10*3/uL (ref 4.0–10.5)
nRBC: 0 % (ref 0.0–0.2)

## 2023-12-05 LAB — LIPASE, BLOOD: Lipase: 25 U/L (ref 11–51)

## 2023-12-05 LAB — URINALYSIS, ROUTINE W REFLEX MICROSCOPIC
Bilirubin Urine: NEGATIVE
Glucose, UA: NEGATIVE mg/dL
Hgb urine dipstick: NEGATIVE
Ketones, ur: 15 mg/dL — AB
Leukocytes,Ua: NEGATIVE
Nitrite: NEGATIVE
Protein, ur: NEGATIVE mg/dL
Specific Gravity, Urine: 1.02 (ref 1.005–1.030)
pH: 5.5 (ref 5.0–8.0)

## 2023-12-05 LAB — COMPREHENSIVE METABOLIC PANEL
ALT: 15 U/L (ref 0–44)
AST: 19 U/L (ref 15–41)
Albumin: 3.7 g/dL (ref 3.5–5.0)
Alkaline Phosphatase: 58 U/L (ref 38–126)
Anion gap: 9 (ref 5–15)
BUN: 11 mg/dL (ref 6–20)
CO2: 20 mmol/L — ABNORMAL LOW (ref 22–32)
Calcium: 8.6 mg/dL — ABNORMAL LOW (ref 8.9–10.3)
Chloride: 105 mmol/L (ref 98–111)
Creatinine, Ser: 0.72 mg/dL (ref 0.44–1.00)
GFR, Estimated: >60 mL/min (ref >60)
Glucose, Bld: 96 mg/dL (ref 70–99)
Potassium: 3.8 mmol/L (ref 3.5–5.1)
Sodium: 134 mmol/L — ABNORMAL LOW (ref 135–145)
Total Bilirubin: 0.8 mg/dL (ref 0.0–1.2)
Total Protein: 6.6 g/dL (ref 6.5–8.1)

## 2023-12-05 LAB — PREGNANCY, URINE: Preg Test, Ur: NEGATIVE

## 2023-12-05 MED ORDER — ONDANSETRON HCL 4 MG/2ML IJ SOLN
4.0000 mg | Freq: Once | INTRAMUSCULAR | Status: AC
  Administered 2023-12-05: 4 mg via INTRAVENOUS
  Filled 2023-12-05: qty 2

## 2023-12-05 MED ORDER — MORPHINE SULFATE (PF) 4 MG/ML IV SOLN
4.0000 mg | Freq: Once | INTRAVENOUS | Status: AC
  Administered 2023-12-05: 4 mg via INTRAVENOUS
  Filled 2023-12-05: qty 1

## 2023-12-05 MED ORDER — IOHEXOL 300 MG/ML  SOLN
100.0000 mL | Freq: Once | INTRAMUSCULAR | Status: AC | PRN
Start: 2023-12-05 — End: 2023-12-05
  Administered 2023-12-05: 100 mL via INTRAVENOUS

## 2023-12-05 MED ORDER — SODIUM CHLORIDE 0.9 % IV BOLUS
1000.0000 mL | Freq: Once | INTRAVENOUS | Status: AC
  Administered 2023-12-05: 1000 mL via INTRAVENOUS

## 2023-12-05 NOTE — ED Triage Notes (Signed)
 Pt states RUQ abdominal pain and nausea that started this am  States diarrhea as well  States now having pain in lower back  as well  Denies urinary complaints

## 2023-12-05 NOTE — ED Provider Notes (Signed)
 [place] [facility] Provider Note   CSN: [id number] Arrival date & time: 12/05/23  1755     History  Chief Complaint  Patient presents with   Abdominal Pain    Tara Rocha is a 43 y.o. female.  Patient with no pertinent past medical history presents today with complaints of abdominal pain, nausea, vomiting, diarrhea.  She states that same began this morning and has been persistent since then.  Pain originally felt like it was more in her right upper quadrant but now it feels like it is everywhere in her abdomen and also radiates around to her back area.  Denies history of similar symptoms previously.  No sick contacts.  No hematemesis, hematochezia or melena.  Has a history of C-section and tubal ligation.  Denies any urinary symptoms.  The history is provided by the patient. No language interpreter was used.  Abdominal Pain Associated symptoms: diarrhea, nausea and vomiting        Home Medications Prior to Admission medications   Medication Sig Start Date End Date Taking? Authorizing Provider  furosemide (LASIX) 20 MG tablet Take one table daily as needed. 05/13/21   Lesly Dukes, MD  Multiple Vitamin (MULTIVITAMIN) tablet Take 1 tablet by mouth daily.    [provider]  Phentermine-Topiramate (QSYMIA) 15-92 MG CP24 Take by mouth.    [provider]      Allergies    Sulfamethoxazole-trimethoprim    Review of Systems   Review of Systems  Gastrointestinal:  Positive for abdominal pain, diarrhea, nausea and vomiting.  All other systems reviewed and are negative.   Physical Exam Updated Vital Signs BP 102/70 (BP Location: Left Arm)   Pulse (!) 103   Temp 97.8 F (36.6 C)   Resp 17   Ht 5\' 4"  (1.626 m)   Wt 100.7 kg   LMP 11/21/2023   SpO2 99%   BMI 38.11 kg/m  Physical Exam Vitals and nursing note reviewed.  Constitutional:      General: She is not in acute distress.    Appearance: Normal  appearance. She is normal weight. She is not ill-appearing, toxic-appearing or diaphoretic.  HENT:     Head: Normocephalic and atraumatic.  Cardiovascular:     Rate and Rhythm: Normal rate.  Pulmonary:     Effort: Pulmonary effort is normal. No respiratory distress.  Abdominal:     General: Abdomen is flat.     Palpations: Abdomen is soft.     Tenderness: There is generalized abdominal tenderness. There is guarding.  Musculoskeletal:        General: Normal range of motion.     Cervical back: Normal range of motion.  Skin:    General: Skin is warm and dry.  Neurological:     General: No focal deficit present.     Mental Status: She is alert.  Psychiatric:        Mood and Affect: Mood normal.        Behavior: Behavior normal.     ED Results / Procedures / Treatments   Labs (all labs ordered are listed, but only abnormal results are displayed) Labs Reviewed  COMPREHENSIVE METABOLIC PANEL - Abnormal; Notable for the following components:      Result Value   Sodium 134 (*)    CO2 20 (*)    Calcium 8.6 (*)    All other components within normal limits  URINALYSIS, ROUTINE W REFLEX MICROSCOPIC - Abnormal; Notable for the  following components:   Ketones, ur 15 (*)    All other components within normal limits  LIPASE, BLOOD  CBC  PREGNANCY, URINE    EKG None  Radiology CT ABDOMEN PELVIS W CONTRAST Result Date: 12/05/2023 CLINICAL DATA:  Abdomen pain EXAM: CT ABDOMEN AND PELVIS WITH CONTRAST TECHNIQUE: Multidetector CT imaging of the abdomen and pelvis was performed using the standard protocol following bolus administration of intravenous contrast. RADIATION DOSE REDUCTION: This exam was performed according to the departmental dose-optimization program which includes automated exposure control, adjustment of the mA and/or kV according to patient size and/or use of iterative reconstruction technique. CONTRAST:  OMNIPAQUE IOHEXOL 300 MG/ML  SOLN COMPARISON:  CT 02/07/2015  FINDINGS: Lower chest: No acute abnormality. Hepatobiliary: No focal liver abnormality is seen. No gallstones, gallbladder wall thickening, or biliary dilatation. Pancreas: Unremarkable. No pancreatic ductal dilatation or surrounding inflammatory changes. Spleen: Normal in size without focal abnormality. Adrenals/Urinary Tract: Adrenal glands are unremarkable. Kidneys are normal, without renal calculi, focal lesion, or hydronephrosis. Bladder is unremarkable. Stomach/Bowel: Stomach nonenlarged. Fluid-filled nondilated small bowel. Loops of slightly thickened small bowel in the upper pelvis, series 301, image 53. Some thickened small bowel in the right upper quadrant as well. Fluid in the colon. Vascular/Lymphatic: No significant vascular findings are present. No enlarged abdominal or pelvic lymph nodes. Reproductive: Uterus and bilateral adnexa are unremarkable. Ligation clips in the posterior pelvis, not in the adnexa Other: No abdominal wall hernia or abnormality. No abdominopelvic ascites. Musculoskeletal: No acute or significant osseous findings. IMPRESSION: Fluid-filled nondilated small bowel with loops of slightly thickened small bowel in the upper pelvis and right upper quadrant, findings suggestive of enteritis, either due to infection or inflammatory bowel disease. Fluid in the colon as well correlating to history of diarrhea. Dislodged tubal ligation clips visualized in the posterior pelvis Electronically Signed   By: Jasmine Pang M.D.   On: 12/05/2023 23:56    Procedures Procedures    Medications Ordered in ED Medications  morphine (PF) 4 MG/ML injection 4 mg (4 mg Intravenous Given 12/05/23 2117)  ondansetron (ZOFRAN) injection 4 mg (4 mg Intravenous Given 12/05/23 2117)  sodium chloride 0.9 % bolus 1,000 mL (0 mLs Intravenous Stopped 12/05/23 2353)  iohexol (OMNIPAQUE) 300 MG/ML solution 100 mL (100 mLs Intravenous Contrast Given 12/05/23 2151)    ED Course/ Medical Decision Making/ A&P                                  Medical Decision Making Amount and/or Complexity of Data Reviewed Labs: ordered. Radiology: ordered.  Risk Prescription drug management.   This patient is a 43 y.o. female who presents to the ED for concern of abdominal pain, nausea, vomiting, diarrhea, this involves an extensive number of treatment options, and is a complaint that carries with it a high risk of complications and morbidity. The emergent differential diagnosis prior to evaluation includes, but is not limited to,  AAA, gastroenteritis, appendicitis, Bowel obstruction, Bowel perforation. Gastroparesis, DKA, Hernia, Inflammatory bowel disease, mesenteric ischemia, pancreatitis, peritonitis SBP, volvulus.   This is not an exhaustive differential.   Past Medical History / Co-morbidities / Social History:  has a past medical history of Depression (2012), Headache, History of staph infection (08-2011), Hypoglycemia, Leg pain, Poor circulation, and Varicose veins.  Additional history: Chart reviewed.  Physical Exam: Physical exam performed. The pertinent findings include: generalized abdominal tenderness to palpation throughout. Uncomfortable appearing  Lab  Tests: I ordered, and personally interpreted labs.  The pertinent results include: No leukocytosis, NA 134, bicarb 20.  Ketonuria, noninfectious   Imaging Studies: I ordered imaging studies including CT abdomen pelvis. I independently visualized and interpreted imaging which showed   Fluid-filled nondilated small bowel with loops of slightly thickened small bowel in the upper pelvis and right upper quadrant, findings suggestive of enteritis, either due to infection or inflammatory bowel disease. Fluid in the colon as well correlating to history of diarrhea.   Dislodged tubal ligation clips visualized in the posterior pelvis  I agree with the radiologist interpretation.   Medications: I ordered medication including morphine, zofran, fluids   for pain, nausea, dehydration. Reevaluation of the patient after these medicines showed that the patient improved. I have reviewed the patients home medicines and have made adjustments as needed.  Disposition: After consideration of the diagnostic results and the patients response to treatment, I feel that emergency department workup does not suggest an emergent condition requiring admission or immediate intervention beyond what has been performed at this time. The plan is: discharge with Augmentin fro enteritis, close outpatient follow-up and return precautions.  After interventions listed above, patient is able to eat and drink without any residual nausea or vomiting.  She is ready to go home.  Will send for Zofran for any additional nausea or vomiting.  Will also send for Bentyl for stomach cramps.  She was also quite uncomfortable on initial exam, will provide a few doses of Vicodin for pain control.  Patient advised not to drive or operate heavy machinery while taking this medication.  PDMP reviewed. Evaluation and diagnostic testing in the emergency department does not suggest an emergent condition requiring admission or immediate intervention beyond what has been performed at this time.  Plan for discharge with close PCP follow-up.  Patient is understanding and amenable with plan, educated on red flag symptoms that would prompt immediate return.  Patient discharged in stable condition.  Final Clinical Impression(s) / ED Diagnoses Final diagnoses:  Enteritis  Nausea vomiting and diarrhea    Rx / DC Orders ED Discharge Orders          Ordered    ondansetron (ZOFRAN-ODT) 4 MG disintegrating tablet  Every 8 hours PRN        12/06/23 0019    dicyclomine (BENTYL) 20 MG tablet  2 times daily        12/06/23 0019    HYDROcodone-acetaminophen (NORCO/VICODIN) 5-325 MG tablet  Every 6 hours PRN        12/06/23 0019    amoxicillin-clavulanate (AUGMENTIN) 875-125 MG tablet  Every 12 hours         12/06/23 0019          An After Visit Summary was printed and given to the patient.     Vear Clock 12/06/23 Marilu Favre, MD 12/09/23 [phone]

## 2023-12-06 ENCOUNTER — Encounter: Payer: Self-pay | Admitting: Obstetrics & Gynecology

## 2023-12-06 MED ORDER — AMOXICILLIN-POT CLAVULANATE 875-125 MG PO TABS: 1.0000 | ORAL_TABLET | Freq: Two times a day (BID) | ORAL | 0 refills | Status: DC

## 2023-12-06 MED ORDER — ONDANSETRON 4 MG PO TBDP
4.0000 mg | ORAL_TABLET | Freq: Three times a day (TID) | ORAL | 0 refills | Status: AC | PRN
Start: 2023-12-06 — End: ?

## 2023-12-06 MED ORDER — DICYCLOMINE HCL 20 MG PO TABS: 20.0000 mg | ORAL_TABLET | Freq: Two times a day (BID) | ORAL | 0 refills | Status: DC

## 2023-12-06 MED ORDER — ONDANSETRON 4 MG PO TBDP: 4.0000 mg | ORAL_TABLET | Freq: Three times a day (TID) | ORAL | 0 refills | Status: DC | PRN

## 2023-12-06 MED ORDER — HYDROCODONE-ACETAMINOPHEN 5-325 MG PO TABS: 1.0000 | ORAL_TABLET | Freq: Four times a day (QID) | ORAL | 0 refills | Status: DC | PRN

## 2023-12-06 NOTE — Discharge Instructions (Addendum)
 As we discussed, your workup in the ER today was overall reassuring for acute findings.  CT imaging of your abdomen did show signs of inflammation around your intestinal tract which is known as enteritis.  This could be a viral infection but could also be bacterial and therefore we will cover for this with Augmentin which is an antibiotic you need to fill and take as prescribed in its entirety.  I have also given you a prescription for Bentyl which she can take as prescribed as needed for stomach cramps, Zofran that you can take as prescribed as needed for nausea and vomiting, and a few doses of Vicodin which is a narcotic pain medication you can take as prescribed as needed for severe pain only.  Do not drive or operate heavy machinery while taking this medication as it can be sedating.  I recommend that you follow-up closely with your primary care provider.  In the interim, take the above prescriptions and please be sure to maintain adequate oral hydration specifically with fluids with electrolytes such as Gatorade or Pedialyte.  Return if development of any new or worsening symptoms.

## 2024-06-20 ENCOUNTER — Telehealth: Payer: Self-pay | Admitting: *Deleted

## 2024-06-20 NOTE — Telephone Encounter (Signed)
 Returned call from 8:34 PM. Left patient a message about no available appointments today with a provider, but to call the office back to see what she needs to be seen for.

## 2024-08-26 ENCOUNTER — Encounter: Payer: Self-pay | Admitting: Obstetrics & Gynecology

## 2024-08-26 ENCOUNTER — Ambulatory Visit: Admitting: Obstetrics & Gynecology

## 2024-08-26 VITALS — BP 106/77 | HR 82 | Ht 65.0 in | Wt 217.0 lb

## 2024-08-26 DIAGNOSIS — M542 Cervicalgia: Secondary | ICD-10-CM | POA: Diagnosis not present

## 2024-08-26 DIAGNOSIS — R232 Flushing: Secondary | ICD-10-CM

## 2024-08-26 DIAGNOSIS — M549 Dorsalgia, unspecified: Secondary | ICD-10-CM | POA: Diagnosis not present

## 2024-08-26 DIAGNOSIS — R102 Pelvic and perineal pain unspecified side: Secondary | ICD-10-CM

## 2024-08-26 DIAGNOSIS — N920 Excessive and frequent menstruation with regular cycle: Secondary | ICD-10-CM

## 2024-08-26 DIAGNOSIS — R41840 Attention and concentration deficit: Secondary | ICD-10-CM

## 2024-08-26 DIAGNOSIS — N898 Other specified noninflammatory disorders of vagina: Secondary | ICD-10-CM

## 2024-08-26 NOTE — Progress Notes (Unsigned)
 Subjective:     Tara Rocha is a 43 y.o. female here for a routine exam.  Current complaints: lost best friend age 40 to breast cancer, recently dx with POTS.  Pt also having hair loss and has appt with dermatologist in 2026   Gynecologic History Patient's last menstrual period was 08/10/2024. Contraception: tubal ligation Last Pap: 2023. Results were: normal Last mammogram: 2023. Results were: normal (diagnostic for pain)  Obstetric History OB History  Gravida Para Term Preterm AB Living  4 3 3  1 3   SAB IAB Ectopic Multiple Live Births  1   0 3    # Outcome Date GA Lbr Len/2nd Weight Sex Type Anes PTL Lv  4 Term 01/19/15 [redacted]w[redacted]d  7 lb 7.2 oz (3.379 kg) M CS-LTranv Spinal, EPI  LIV  3 Term 03/18/11 [redacted]w[redacted]d  7 lb 4 oz (3.289 kg) F CS-Classical EPI  LIV     Birth Comments: poly hydramnios  2 SAB 02/24/10 [redacted]w[redacted]d         1 Term 04/27/07 [redacted]w[redacted]d 24:00 9 lb (4.082 kg) F CS-Classical EPI  LIV     The following portions of the patient's history were reviewed and updated as appropriate: allergies, current medications, past family history, past medical history, past social history, past surgical history, and problem list.  Review of Systems Pertinent items noted in HPI and remainder of comprehensive ROS otherwise negative.    Objective:     Vitals:   08/26/24 1003  Weight: 217 lb (98.4 kg)  Height: 5' 5 (1.651 m)   Vitals:  WNL General appearance: alert, cooperative and no distress  HEENT: Normocephalic, without obvious abnormality, atraumatic Eyes: negative Throat: lips, mucosa, and tongue normal; teeth and gums normal  Respiratory: Clear to auscultation bilaterally  CV: Regular rate and rhythm  Breasts:  Normal appearance, no masses or tenderness, no nipple retraction or dimpling  GI: Soft, non-tender; bowel sounds normal; no masses,  no organomegaly  GU: External Genitalia:  Tanner V, no lesion Urethra:  No prolapse   Vagina: Pink, normal rugae, no blood or discharge   Cervix: No CMT, no lesion  Uterus:  Normal size and contour, non tender  Adnexa: Normal, no masses, non tender  Musculoskeletal: No edema, redness or tenderness in the calves or thighs  Skin: No lesions or rash  Lymphatic: Axillary adenopathy: none     Psychiatric: Normal mood and behavior        Assessment:    Healthy female exam.   Hair loss Vaginal dryness Back, neck and pelvic pain Attention issues [place] menstrual cycles / hot flashes Plan:    Pap up to date Screening mammogram Genetic testing [facility] ([facility])  Vaginal dryness--moisturizer list put in AVS Back, neck and pelvic pain--would like to go back to PT, referral given Attention issues and feeling tired.  PCP worked her up with labs and I suggested sleep evaluation and ADHD evaluation.  OCPs to help with heavy menstrual cycles and some hot flashes.

## 2024-08-26 NOTE — Progress Notes (Unsigned)
 Last pap smear (date and result):11/15/2021 Last mammogram (date and result):pt reports 2024 with [facility]  Last colon screening (date and result):pt reports 2023 Brush:Yes Floss:Yes Seatbelts: yes Sunscreen: yes

## 2024-08-26 NOTE — Patient Instructions (Addendum)
 ADHD work up  [email or web address]   Vaginal and Vulvar Moisturizers.    Moisturizers  They are used in the vagina to hydrate the mucous membrane that make up the vaginal canal.  Designed to keep a more normal acid balance (ph)  Once placed in the vagina, it will last between two to three days.  Use 2-3 times per week at bedtime  Ingredients to avoid is glycerin  and fragrance, can increase chance of infection  Should not be used just before sex due to causing irritation  Most are gels administered either in a tampon-shaped applicator or as a vaginal suppository. They are non-hormonal.  Best to massage the moisturizers into the vaginal canal and vulva area   Types of Moisturizers  Vitamin E vaginal suppositories- Whole foods, Amazon  Moist Again  Coconut oil- can break down condoms  Julva- (Do no use if on Tamoxifen) amazon  Yes moisturizer- amazon  NeuEve Silk , NeuEve Silver for menopausal or over 65 (if have severe vaginal atrophy or cancer treatments use NeuEve Silk for 1 month than move to [employer])- [employer], [place]  Olive and Bee intimate cream- www.oliveandbee.com.au  Mae vaginal moisturizer- Amazon  Aloe  Bonafide  Hyaloronic Acid suppositories   Creams to use externally on the Vulva area daily  Marathon Oil (good for for cancer patients that had radiation to the area)- amazon or [employer].https://garcia-valdez.org/  V-magic cream - amazon  Julva-amazon  Vital V Wild Yam salve ( help moisturize and help with thinning vulvar area, does have Beeswax  MoodMaid Botanical Pro-Meno Wild Yam Cream- Amazon  Desert Harvest Gele  Cleo by Sherrlyn labial moisturizer (Amazon,  Coconut or olive oil  Aloe  Yes  Good Clean Love  Enchanted rose    Things to avoid in the vaginal area  Do not use things to irritate the vulvar area    No lotions just specialized creams for the vulva area- Neogyn, V-magic, No soaps; can use Aveeno or Calendula cleanser if needed. Must be gentle   No deodorants  No douches  Good to sleep without underwear to let the vaginal area to air out  No scrubbing: spread the lips to let warm water rinse over labias and pat dry

## 2024-08-28 MED ORDER — DROSPIRENONE-ETHINYL ESTRADIOL 3-0.02 MG PO TABS: 1.0000 | ORAL_TABLET | Freq: Every day | ORAL | 11 refills | Status: AC

## 2024-09-02 ENCOUNTER — Other Ambulatory Visit
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
## 2024-09-05 ENCOUNTER — Ambulatory Visit: Admitting: Rehabilitative and Restorative Service Providers"

## 2024-09-05 ENCOUNTER — Other Ambulatory Visit: Payer: Self-pay

## 2024-09-05 ENCOUNTER — Encounter: Payer: Self-pay | Admitting: Rehabilitative and Restorative Service Providers"

## 2024-09-05 DIAGNOSIS — R102 Pelvic and perineal pain unspecified side: Secondary | ICD-10-CM | POA: Insufficient documentation

## 2024-09-05 DIAGNOSIS — M539 Dorsopathy, unspecified: Secondary | ICD-10-CM | POA: Insufficient documentation

## 2024-09-05 DIAGNOSIS — M549 Dorsalgia, unspecified: Secondary | ICD-10-CM | POA: Insufficient documentation

## 2024-09-05 DIAGNOSIS — M6281 Muscle weakness (generalized): Secondary | ICD-10-CM

## 2024-09-05 DIAGNOSIS — M542 Cervicalgia: Secondary | ICD-10-CM | POA: Diagnosis not present

## 2024-09-05 DIAGNOSIS — M545 Low back pain, unspecified: Secondary | ICD-10-CM

## 2024-09-05 DIAGNOSIS — R29898 Other symptoms and signs involving the musculoskeletal system: Secondary | ICD-10-CM | POA: Insufficient documentation

## 2024-09-09 ENCOUNTER — Ambulatory Visit

## 2024-09-11 ENCOUNTER — Ambulatory Visit

## 2024-09-11 DIAGNOSIS — M545 Low back pain, unspecified: Secondary | ICD-10-CM | POA: Diagnosis not present

## 2024-09-11 DIAGNOSIS — M6281 Muscle weakness (generalized): Secondary | ICD-10-CM

## 2024-09-11 DIAGNOSIS — R102 Pelvic and perineal pain unspecified side: Secondary | ICD-10-CM

## 2024-09-11 DIAGNOSIS — R29898 Other symptoms and signs involving the musculoskeletal system: Secondary | ICD-10-CM

## 2024-09-11 DIAGNOSIS — M539 Dorsopathy, unspecified: Secondary | ICD-10-CM

## 2024-09-11 NOTE — Patient Instructions (Addendum)
Strategies for Constipation - Developing a Bowel Routine  Bowels love routine.  Choose the best time of day to have a bowel movement.  Usually the best time of day for a bowel movement in 30 min to 1 hour after breakfast or lunch.  It can take a week or longer to transition to new bowel routines, but if you follow the guidelines below, you will start to retrain your system for more successful bowel routines.  Eat breakfast every morning to try to get things moving along.  Eating stimulates the bowels through turning on the gastrocolic reflex, which are the wave-like contractions of smooth muscle in your intestines to help move feces along.  Adding in a hot beverage with breakfast may help as well.  Think of your digestive tract as being a conveyor belt - new food comes in and pushes along "old food," with the end of the line being a bowel movement.  You may add 1/4 cup or 1/3 cup of prune juice every evening to help stimulate morning bowel function until you see more successful bowel function.  Create routine in both your timing and portions of intake of food and drink throughout the day.  Eat each of your meals at consistent times of the day.  For portions, the size of each meal may vary, but try to eat consistent portions each day at breakfast, lunch and dinner.  For example, you may have a small breakfast every day and a big lunch every day. This is fine if it is a consistent pattern of intake.  With each meal, aim for at least 2 servings of fruits and vegetables and at least one serving of whole grains (whole grain cereal, brown rice, bran, whole wheat or rye bread, oatmeal) to get some fiber in your system.  These foods provide soft bulk for the bowel.  Drink water.  Aim for at least 8 large glasses of water a day.  Water helps the stool stay softer and easier to pass.  If you are adding more fiber in your diet, be sure to increase your water intake too.  Exercise can help move things along the  digestive tract.  Exercise may be a walk, jog, yoga, exercise video, gym time, or even household walking or marching in place at the kitchen countertop.  Pairing exercise and breakfast as part of your morning routine may help establish a morning bowel routine.  Listen to your body's signals that it is time to go to the bathroom!  Our body has automatic reflexes that signal when it is time to have a bowel movement.  If you are establishing routines to help stimulate your bowels, be sure to allow time for toileting.  For example, if you typically have a bowel movement after breakfast, get up early enough to eat AND empty your bowels before your schedule gets too busy or before it becomes inconvenient to access a bathroom.  If we routinely delay or hold back a bowel movement, these reflexes can stop signaling, leading to constipation which may become chronic (long-term).    Ensure proper toileting technique including posture, breathing and emptying strategies.*  Abdominal massage may also help stimulate your bowels and reduce the discomfort that accompanies constipation.*  *Your PT can educate you on toileting strategies and self-massage for your abdomen.  Your PT may also ask you to complete a food log and bowel diary for 1-3 days to help identify patterns and make suggestions for increased success in managing your   symptoms.  
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
## 2024-09-13 ENCOUNTER — Ambulatory Visit: Payer: Self-pay | Admitting: Obstetrics & Gynecology

## 2024-09-16 ENCOUNTER — Ambulatory Visit

## 2024-09-20 ENCOUNTER — Ambulatory Visit

## 2024-10-23 ENCOUNTER — Other Ambulatory Visit: Payer: Self-pay | Admitting: Medical Genetics
